# Patient Record
Sex: Female | Born: 1990 | Race: Black or African American | Hispanic: No | Marital: Single | Smoking: Never smoker
Health system: Southern US, Community
[De-identification: ages and names within clinical notes are randomized; demographics above are authoritative.]

## PROBLEM LIST (undated history)

## (undated) ENCOUNTER — Inpatient Hospital Stay (HOSPITAL_COMMUNITY): Payer: Self-pay

## (undated) DIAGNOSIS — Z789 Other specified health status: Secondary | ICD-10-CM

## (undated) DIAGNOSIS — L409 Psoriasis, unspecified: Secondary | ICD-10-CM

## (undated) DIAGNOSIS — O1400 Mild to moderate pre-eclampsia, unspecified trimester: Secondary | ICD-10-CM

## (undated) HISTORY — DX: Mild to moderate pre-eclampsia, unspecified trimester: O14.00

## (undated) HISTORY — PX: WISDOM TOOTH EXTRACTION: SHX21

---
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
## 2014-08-16 ENCOUNTER — Encounter (HOSPITAL_COMMUNITY): Payer: Self-pay

## 2017-02-06 ENCOUNTER — Emergency Department (HOSPITAL_COMMUNITY)
Admission: EM | Admit: 2017-02-06 | Discharge: 2017-02-06 | Disposition: A | Discharge status: Home / Self Care | Payer: Medicaid Other | Attending: Emergency Medicine | Admitting: Emergency Medicine

## 2017-02-06 ENCOUNTER — Encounter (HOSPITAL_COMMUNITY): Payer: Self-pay

## 2017-02-06 DIAGNOSIS — R21 Rash and other nonspecific skin eruption: Secondary | ICD-10-CM

## 2017-02-06 DIAGNOSIS — H60391 Other infective otitis externa, right ear: Secondary | ICD-10-CM

## 2017-02-06 MED ORDER — OFLOXACIN 0.3 % OP SOLN: 1.0000 [drp] | Freq: Two times a day (BID) | OPHTHALMIC | 0 refills | Status: DC

## 2017-02-06 MED ORDER — AMOXICILLIN 500 MG PO CAPS: 500.0000 mg | ORAL_CAPSULE | Freq: Three times a day (TID) | ORAL | 0 refills | Status: DC

## 2017-02-06 NOTE — ED Triage Notes (Signed)
Pt with bilateral earache for past 2 days.  Pt also has facial rash which has been treated with medication and no improvement.

## 2017-02-06 NOTE — ED Provider Notes (Signed)
[facility]-EMERGENCY DEPT Provider Note   CSN: [id number] Arrival date & time: 02/06/17  4098     History   Chief Complaint Chief Complaint  Patient presents with  . Otalgia  . Rash    HPI Shelley [name] is a 26 y.o. female.  HPI Pt comes in with cc of earache and rash. Pt reports that she has been having rash to the face and behind her ears for more than a year. She also has earache on the R side. Pt reports some drainage from the ear. Pt has no n/v/f/c/neck pain/stiffness. Pt reports no change to the rash. She has seen a dermatologist in the past, and the meds they gave her caused some improvement,  But then the rash worsened again. PT denies drug use.   History reviewed. No pertinent past medical history.  There are no active problems to display for this patient.   Past Surgical History:  Procedure Laterality Date  . WISDOM TOOTH EXTRACTION      OB History    Gravida Para Term Preterm AB Living   1 1 0 0 0 0   SAB TAB Ectopic Multiple Live Births   0 0 0 1 1       Home Medications    Prior to Admission medications   Medication Sig Start Date End Date Taking? Authorizing Provider  amoxicillin (AMOXIL) 500 MG capsule Take 1 capsule (500 mg total) by mouth 3 (three) times daily. 02/06/17   Derwood Kaplan, MD  ofloxacin (OCUFLOX) 0.3 % ophthalmic solution Place 1 drop into the right ear 2 (two) times daily. 02/06/17   Derwood Kaplan, MD  prenatal vitamin w/FE, FA (PRENATAL 1 + 1) 27-1 MG TABS Take 1 tablet by mouth at bedtime.      Historical Provider, MD    Family History Family History  Problem Relation Age of Onset  . Diabetes Maternal Grandmother   . Hypertension Maternal Grandmother   . Asthma Maternal Grandmother     Social History Social History  Substance Use Topics  . Smoking status: Never Smoker  . Smokeless tobacco: Never Used  . Alcohol use No     Allergies   Patient has no known allergies.   Review of Systems Review of Systems    Constitutional: Negative for activity change.  HENT: Positive for ear pain.   Respiratory: Negative for shortness of breath.   Gastrointestinal: Negative for nausea.  Skin: Positive for rash.  Allergic/Immunologic: Negative for immunocompromised state.     Physical Exam Updated Vital Signs BP 127/73 (BP Location: Left Arm)   Pulse 67   Temp 98.2 F (36.8 C) (Oral)   Resp 18   LMP  (LMP Unknown)   SpO2 100%   Physical Exam  Constitutional: She is oriented to person, place, and time. She appears well-developed.  HENT:  Head: Normocephalic and atraumatic.  Mouth/Throat: No oropharyngeal exudate.  R ear has debri in the external canal and the external TM is erythematous. Pt has no trismus, stridor or crepitus over the neck. Pt has no tonsillar enlargement and exudates. Pharynx is erythematous.   Eyes: EOM are normal.  Neck: Normal range of motion. Neck supple.  Cardiovascular: Normal rate.   Pulmonary/Chest: Effort normal.  Abdominal: Bowel sounds are normal.  Neurological: She is alert and oriented to person, place, and time.  Skin: Skin is warm and dry. Rash noted.  Nursing note and vitals reviewed.        ED Treatments / Results  Labs (all  labs ordered are listed, but only abnormal results are displayed) Labs Reviewed - No data to display  EKG  EKG Interpretation None       Radiology No results found.  Procedures Procedures (including critical care time)  Medications Ordered in ED Medications - No data to display   Initial Impression / Assessment and Plan / ED Course  I have reviewed the triage vital signs and the nursing notes.  Pertinent labs & imaging results that were available during my care of the patient were reviewed by me and considered in my medical decision making (see chart for details).     Rash - not new. Strongly advised that pt see a Dermatologist and continue to follow up with them. Could be fungal. The back of the ear is also  affected on both sides. No meds now. Strict ER return precautions have been discussed, and patient is agreeing with the plan and is comfortable with the workup done and the recommendations from the ER.  Earache - I think the rash within the ear has led to skin breakdown and resultant infection. 3rd infection this year, which is not common. Pt will get OE tx, advised amoxi only if ear not improving after 3 days of OE tx.   Final Clinical Impressions(s) / ED Diagnoses   Final diagnoses:  Other infective acute otitis externa of right ear  Skin rash    New Prescriptions Discharge Medication List as of 02/06/2017  9:38 AM    START taking these medications   Details  amoxicillin (AMOXIL) 500 MG capsule Take 1 capsule (500 mg total) by mouth 3 (three) times daily., Starting Wed 02/06/2017, Print    ofloxacin (OCUFLOX) 0.3 % ophthalmic solution Place 1 drop into the right ear 2 (two) times daily., Starting Wed 02/06/2017, Print         Derwood Kaplan, MD 02/07/17 [phone]

## 2017-02-06 NOTE — Discharge Instructions (Signed)
Please see the dermatologist as requested for optimal care of the rash.  START WITH THE TOPICAL EAR DROPS  - take the amoxicillin only if the symptoms are not improving after 2-3 days.  Please return to the ER if your symptoms worsen; you have increased pain, fevers, chills, inability to keep any medications down, headaches, neck pain, confusion. Otherwise see the outpatient doctor as requested.  Provided are names of a lot of dermatologist in town. Please contact one of them soon.

## 2017-08-21 ENCOUNTER — Inpatient Hospital Stay (HOSPITAL_COMMUNITY)
Admission: AD | Admit: 2017-08-21 | Discharge: 2017-08-21 | Disposition: A | Discharge status: Home / Self Care | Payer: Medicaid Other | Source: Ambulatory Visit | Attending: Obstetrics & Gynecology | Admitting: Obstetrics & Gynecology

## 2017-08-21 ENCOUNTER — Encounter (HOSPITAL_COMMUNITY): Payer: Self-pay | Admitting: *Deleted

## 2017-08-21 ENCOUNTER — Inpatient Hospital Stay (HOSPITAL_COMMUNITY): Payer: Medicaid Other

## 2017-08-21 DIAGNOSIS — N760 Acute vaginitis: Secondary | ICD-10-CM | POA: Diagnosis not present

## 2017-08-21 DIAGNOSIS — Z3A01 Less than 8 weeks gestation of pregnancy: Secondary | ICD-10-CM | POA: Insufficient documentation

## 2017-08-21 DIAGNOSIS — O219 Vomiting of pregnancy, unspecified: Secondary | ICD-10-CM | POA: Diagnosis not present

## 2017-08-21 DIAGNOSIS — O26891 Other specified pregnancy related conditions, first trimester: Secondary | ICD-10-CM

## 2017-08-21 DIAGNOSIS — B9689 Other specified bacterial agents as the cause of diseases classified elsewhere: Secondary | ICD-10-CM | POA: Diagnosis not present

## 2017-08-21 DIAGNOSIS — O23591 Infection of other part of genital tract in pregnancy, first trimester: Secondary | ICD-10-CM | POA: Diagnosis not present

## 2017-08-21 DIAGNOSIS — Z3491 Encounter for supervision of normal pregnancy, unspecified, first trimester: Secondary | ICD-10-CM

## 2017-08-21 DIAGNOSIS — R109 Unspecified abdominal pain: Secondary | ICD-10-CM | POA: Diagnosis present

## 2017-08-21 HISTORY — DX: Other specified health status: Z78.9

## 2017-08-21 LAB — URINALYSIS, ROUTINE W REFLEX MICROSCOPIC
Bacteria, UA: NONE SEEN
Bilirubin Urine: NEGATIVE
GLUCOSE, UA: NEGATIVE mg/dL
HGB URINE DIPSTICK: NEGATIVE
Ketones, ur: 80 mg/dL — AB
NITRITE: NEGATIVE
PH: 5 (ref 5.0–8.0)
Protein, ur: 30 mg/dL — AB
Specific Gravity, Urine: 1.028 (ref 1.005–1.030)

## 2017-08-21 LAB — CBC
HCT: 35.4 % — ABNORMAL LOW (ref 36.0–46.0)
Hemoglobin: 12.9 g/dL (ref 12.0–15.0)
MCH: 30.9 pg (ref 26.0–34.0)
MCHC: 36.4 g/dL — AB (ref 30.0–36.0)
MCV: 84.9 fL (ref 78.0–100.0)
PLATELETS: 312 10*3/uL (ref 150–400)
RBC: 4.17 MIL/uL (ref 3.87–5.11)
RDW: 11.2 % — ABNORMAL LOW (ref 11.5–15.5)
WBC: 7.5 10*3/uL (ref 4.0–10.5)

## 2017-08-21 LAB — ABO/RH: ABO/RH(D): A POS

## 2017-08-21 LAB — WET PREP, GENITAL
SPERM: NONE SEEN
TRICH WET PREP: NONE SEEN
Yeast Wet Prep HPF POC: NONE SEEN

## 2017-08-21 LAB — HCG, QUANTITATIVE, PREGNANCY: hCG, Beta Chain, Quant, S: 73666 m[IU]/mL — ABNORMAL HIGH (ref <5)

## 2017-08-21 LAB — POCT PREGNANCY, URINE: Preg Test, Ur: POSITIVE — AB

## 2017-08-21 MED ORDER — PROMETHAZINE HCL 25 MG RE SUPP: 25.0000 mg | Freq: Four times a day (QID) | RECTAL | 2 refills | Status: DC | PRN

## 2017-08-21 MED ORDER — METRONIDAZOLE 0.75 % VA GEL: 1.0000 | Freq: Two times a day (BID) | VAGINAL | 0 refills | Status: DC

## 2017-08-21 MED ORDER — PROMETHAZINE HCL 25 MG PO TABS: 25.0000 mg | ORAL_TABLET | Freq: Four times a day (QID) | ORAL | 2 refills | Status: DC | PRN

## 2017-08-21 NOTE — Discharge Instructions (Signed)
First Trimester of Pregnancy °The first trimester of pregnancy is from week 1 until the end of week 13 (months 1 through 3). A week after a sperm fertilizes an egg, the egg will implant on the wall of the uterus. This embryo will begin to develop into a baby. Genes from you and your partner will form the baby. The female genes will determine whether the baby will be a boy or a girl. At 6-8 weeks, the eyes and face will be formed, and the heartbeat can be seen on ultrasound. At the end of 12 weeks, all the baby's organs will be formed. °Now that you are pregnant, you will want to do everything you can to have a healthy baby. Two of the most important things are to get good prenatal care and to follow your health care provider's instructions. Prenatal care is all the medical care you receive before the baby's birth. This care will help prevent, find, and treat any problems during the pregnancy and childbirth. °Body changes during your first trimester °Your body goes through many changes during pregnancy. The changes vary from woman to woman. °· You may gain or lose a couple of pounds at first. °· You may feel sick to your stomach (nauseous) and you may throw up (vomit). If the vomiting is uncontrollable, call your health care provider. °· You may tire easily. °· You may develop headaches that can be relieved by medicines. All medicines should be approved by your health care provider. °· You may urinate more often. Painful urination may mean you have a bladder infection. °· You may develop heartburn as a result of your pregnancy. °· You may develop constipation because certain hormones are causing the muscles that push stool through your intestines to slow down. °· You may develop hemorrhoids or swollen veins (varicose veins). °· Your breasts may begin to grow larger and become tender. Your nipples may stick out more, and the tissue that surrounds them (areola) may become darker. °· Your gums may bleed and may be  sensitive to brushing and flossing. °· Dark spots or blotches (chloasma, mask of pregnancy) may develop on your face. This will likely fade after the baby is born. °· Your menstrual periods will stop. °· You may have a loss of appetite. °· You may develop cravings for certain kinds of food. °· You may have changes in your emotions from day to day, such as being excited to be pregnant or being concerned that something may go wrong with the pregnancy and baby. °· You may have more vivid and strange dreams. °· You may have changes in your hair. These can include thickening of your hair, rapid growth, and changes in texture. Some women also have hair loss during or after pregnancy, or hair that feels dry or thin. Your hair will most likely return to normal after your baby is born. ° °What to expect at prenatal visits °During a routine prenatal visit: °· You will be weighed to make sure you and the baby are growing normally. °· Your blood pressure will be taken. °· Your abdomen will be measured to track your baby's growth. °· The fetal heartbeat will be listened to between weeks 10 and 14 of your pregnancy. °· Test results from any previous visits will be discussed. ° °Your health care provider may ask you: °· How you are feeling. °· If you are feeling the baby move. °· If you have had any abnormal symptoms, such as leaking fluid, bleeding, severe headaches,   or abdominal cramping. °· If you are using any tobacco products, including cigarettes, chewing tobacco, and electronic cigarettes. °· If you have any questions. ° °Other tests that may be performed during your first trimester include: °· Blood tests to find your blood type and to check for the presence of any previous infections. The tests will also be used to check for low iron levels (anemia) and protein on red blood cells (Rh antibodies). Depending on your risk factors, or if you previously had diabetes during pregnancy, you may have tests to check for high blood  sugar that affects pregnant women (gestational diabetes). °· Urine tests to check for infections, diabetes, or protein in the urine. °· An ultrasound to confirm the proper growth and development of the baby. °· Fetal screens for spinal cord problems (spina bifida) and Down syndrome. °· HIV (human immunodeficiency virus) testing. Routine prenatal testing includes screening for HIV, unless you choose not to have this test. °· You may need other tests to make sure you and the baby are doing well. ° °Follow these instructions at home: °Medicines °· Follow your health care provider's instructions regarding medicine use. Specific medicines may be either safe or unsafe to take during pregnancy. °· Take a prenatal vitamin that contains at least 600 micrograms (mcg) of folic acid. °· If you develop constipation, try taking a stool softener if your health care provider approves. °Eating and drinking °· Eat a balanced diet that includes fresh fruits and vegetables, whole grains, good sources of protein such as meat, eggs, or tofu, and low-fat dairy. Your health care provider will help you determine the amount of weight gain that is right for you. °· Avoid raw meat and uncooked cheese. These carry germs that can cause birth defects in the baby. °· Eating four or five small meals rather than three large meals a day may help relieve nausea and vomiting. If you start to feel nauseous, eating a few soda crackers can be helpful. Drinking liquids between meals, instead of during meals, also seems to help ease nausea and vomiting. °· Limit foods that are high in fat and processed sugars, such as fried and sweet foods. °· To prevent constipation: °? Eat foods that are high in fiber, such as fresh fruits and vegetables, whole grains, and beans. °? Drink enough fluid to keep your urine clear or pale yellow. °Activity °· Exercise only as directed by your health care provider. Most women can continue their usual exercise routine during  pregnancy. Try to exercise for 30 minutes at least 5 days a week. Exercising will help you: °? Control your weight. °? Stay in shape. °? Be prepared for labor and delivery. °· Experiencing pain or cramping in the lower abdomen or lower back is a good sign that you should stop exercising. Check with your health care provider before continuing with normal exercises. °· Try to avoid standing for long periods of time. Move your legs often if you must stand in one place for a long time. °· Avoid heavy lifting. °· Wear low-heeled shoes and practice good posture. °· You may continue to have sex unless your health care provider tells you not to. °Relieving pain and discomfort °· Wear a good support bra to relieve breast tenderness. °· Take warm sitz baths to soothe any pain or discomfort caused by hemorrhoids. Use hemorrhoid cream if your health care provider approves. °· Rest with your legs elevated if you have leg cramps or low back pain. °· If you develop   varicose veins in your legs, wear support hose. Elevate your feet for 15 minutes, 3-4 times a day. Limit salt in your diet. Prenatal care  Schedule your prenatal visits by the twelfth week of pregnancy. They are usually scheduled monthly at first, then more often in the last 2 months before delivery.  Write down your questions. Take them to your prenatal visits.  Keep all your prenatal visits as told by your health care provider. This is important. Safety  Wear your seat belt at all times when driving.  Make a list of emergency phone numbers, including numbers for family, friends, the hospital, and police and fire departments. General instructions  Ask your health care provider for a referral to a local prenatal education class. Begin classes no later than the beginning of month 6 of your pregnancy.  Ask for help if you have counseling or nutritional needs during pregnancy. Your health care provider can offer advice or refer you to specialists for help  with various needs.  Do not use hot tubs, steam rooms, or saunas.  Do not douche or use tampons or scented sanitary pads.  Do not cross your legs for long periods of time.  Avoid cat litter boxes and soil used by cats. These carry germs that can cause birth defects in the baby and possibly loss of the fetus by miscarriage or stillbirth.  Avoid all smoking, herbs, alcohol, and medicines not prescribed by your health care provider. Chemicals in these products affect the formation and growth of the baby.  Do not use any products that contain nicotine or tobacco, such as cigarettes and e-cigarettes. If you need help quitting, ask your health care provider. You may receive counseling support and other resources to help you quit.  Schedule a dentist appointment. At home, brush your teeth with a soft toothbrush and be gentle when you floss. Contact a health care provider if:  You have dizziness.  You have mild pelvic cramps, pelvic pressure, or nagging pain in the abdominal area.  You have persistent nausea, vomiting, or diarrhea.  You have a bad smelling vaginal discharge.  You have pain when you urinate.  You notice increased swelling in your face, hands, legs, or ankles.  You are exposed to fifth disease or chickenpox.  You are exposed to [place] measles (rubella) and have never had it. Get help right away if:  You have a fever.  You are leaking fluid from your vagina.  You have spotting or bleeding from your vagina.  You have severe abdominal cramping or pain.  You have rapid weight gain or loss.  You vomit blood or material that looks like coffee grounds.  You develop a severe headache.  You have shortness of breath.  You have any kind of trauma, such as from a fall or a car accident. Summary  The first trimester of pregnancy is from week 1 until the end of week 13 (months 1 through 3).  Your body goes through many changes during pregnancy. The changes vary from  woman to woman.  You will have routine prenatal visits. During those visits, your health care provider will examine you, discuss any test results you may have, and talk with you about how you are feeling. This information is not intended to replace advice given to you by your health care provider. Make sure you discuss any questions you have with your health care provider. Document Released: 09/25/2001 Document Revised: 09/12/2016 Document Reviewed: 09/12/2016 Elsevier Interactive Patient Education  2017 Elsevier  Inc.  [place] [facility] [facility] Providers    [facility]       Phone: [phone]  [facility] [place] Phone: [phone]  [facility] [place]  Phone: [phone]  [facility]  Phone: [phone]  [facility] [place]  Phone: [phone]  [facility]       Phone: [phone]  [facility]    Phone: [phone]   [facility]    Phone: [phone]  [facility]    Phone: [phone]  [facility]    Phone: [phone]  [facility]    Phone: [phone]  [facility]       Phone: [phone]   [facility]  Phone: [phone]  [facility]    Phone: [phone]  [facility] [place]   Phone: [phone]  [facility]      Phone: [phone]  [facility]    Phone: [phone]  Safe Medications in Pregnancy   Acne: Benzoyl Peroxide Salicylic Acid  Backache/Headache: Tylenol: 2 regular strength every 4 hours OR              2 Extra strength every 6 hours  Colds/Coughs/Allergies: Benadryl (alcohol free) 25 mg every 6 hours as needed Breath right  strips Claritin Cepacol throat lozenges Chloraseptic throat spray Cold-Eeze- up to three times per day Cough drops, alcohol free Flonase (by prescription only) Guaifenesin Mucinex Robitussin DM (plain only, alcohol free) Saline nasal spray/drops Sudafed (pseudoephedrine) & Actifed ** use only after [redacted] weeks gestation and if you do not have high blood pressure Tylenol Vicks Vaporub Zinc lozenges Zyrtec   Constipation: Colace Ducolax suppositories Fleet enema Glycerin suppositories Metamucil Milk of magnesia Miralax Senokot Smooth move tea  Diarrhea: Kaopectate Imodium A-D  *NO pepto Bismol  Hemorrhoids: Anusol Anusol HC Preparation H Tucks  Indigestion: Tums Maalox Mylanta Zantac  Pepcid  Insomnia: Benadryl (alcohol free) 25mg  every 6 hours as needed Tylenol PM Unisom, no Gelcaps  Leg Cramps: Tums MagGel  Nausea/Vomiting:  Bonine Dramamine Emetrol Ginger extract Sea bands Meclizine  Nausea medication to take during pregnancy:  Unisom (doxylamine succinate 25 mg tablets) Take one tablet daily at bedtime. If symptoms are not adequately controlled, the dose can be increased to a maximum recommended dose of two tablets daily (1/2 tablet in the morning, 1/2 tablet mid-afternoon and one at bedtime). Vitamin B6 100mg  tablets. Take one tablet twice a day (up to 200 mg per day).  Skin Rashes: Aveeno products Benadryl cream or 25mg  every 6 hours as needed Calamine Lotion 1% cortisone cream  Yeast infection: Gyne-lotrimin 7 Monistat 7   **If taking multiple medications, please check labels to avoid duplicating the same active ingredients **take medication as directed on the label ** Do not exceed 4000 mg of tylenol in 24 hours **Do not take medications that contain aspirin or ibuprofen

## 2017-08-21 NOTE — MAU Provider Note (Signed)
History     CSN: [id number]  Arrival date and time: 08/21/17 1626   First Provider Initiated Contact with Patient 08/21/17 1702      Chief Complaint  Patient presents with  . Possible Pregnancy  . Abdominal Pain  . Emesis  . Nausea   HPI Shelley [name] is a 26 y.o. G2P0000 at [redacted]w[redacted]d who presents with lower abdominal pain. She states the pain started 2 days ago and is like cramping. She rates the pain a 6/10 and has not tried anything for the pain. She denies any vaginal bleeding or abnormal discharge. She also reports nausea and vomiting for the last 2 days. She throws up 2 times in the mornings.   OB History    Gravida Para Term Preterm AB Living   2 1 0 0 0 0   SAB TAB Ectopic Multiple Live Births   0 0 0 1 1      Past Medical History:  Diagnosis Date  . Medical history non-contributory     Past Surgical History:  Procedure Laterality Date  . WISDOM TOOTH EXTRACTION      Family History  Problem Relation Age of Onset  . Diabetes Maternal Grandmother   . Hypertension Maternal Grandmother   . Asthma Maternal Grandmother     Social History   Tobacco Use  . Smoking status: Never Smoker  . Smokeless tobacco: Never Used  Substance Use Topics  . Alcohol use: No  . Drug use: No    Allergies: No Known Allergies  Medications Prior to Admission  Medication Sig Dispense Refill Last Dose  . amoxicillin (AMOXIL) 500 MG capsule Take 1 capsule (500 mg total) by mouth 3 (three) times daily. 21 capsule 0   . ofloxacin (OCUFLOX) 0.3 % ophthalmic solution Place 1 drop into the right ear 2 (two) times daily. 5 mL 0   . prenatal vitamin w/FE, FA (PRENATAL 1 + 1) 27-1 MG TABS Take 1 tablet by mouth at bedtime.     Not Taking at Unknown time    Review of Systems  Constitutional: Negative.  Negative for fatigue and fever.  HENT: Negative.   Respiratory: Negative.  Negative for shortness of breath.   Cardiovascular: Negative.  Negative for chest pain.  Gastrointestinal:  Positive for abdominal pain, nausea and vomiting. Negative for constipation and diarrhea.  Genitourinary: Negative.  Negative for dysuria, vaginal bleeding and vaginal discharge.  Neurological: Negative.  Negative for dizziness and headaches.   Physical Exam   Blood pressure 112/82, pulse (!) 103, temperature 98.1 F (36.7 C), temperature source Oral, resp. rate 16, height 5\' 3"  (1.6 m), weight 116 lb (52.6 kg), last menstrual period 07/10/2017, SpO2 100 %.  Physical Exam  Nursing note and vitals reviewed. Constitutional: She is oriented to person, place, and time. She appears well-developed and well-nourished. No distress.  HENT:  Head: Normocephalic.  Eyes: Pupils are equal, round, and reactive to light.  Cardiovascular: Normal rate, regular rhythm and normal heart sounds.  Respiratory: Effort normal and breath sounds normal. No respiratory distress.  GI: Soft. Bowel sounds are normal. She exhibits no distension. There is no tenderness.  Neurological: She is alert and oriented to person, place, and time.  Skin: Skin is warm and dry.  Psychiatric: She has a normal mood and affect. Her behavior is normal. Judgment and thought content normal.   Pelvic exam: Cervix pink, visually closed, without lesion, scant white creamy discharge, vaginal walls and external genitalia normal Bimanual exam: Cervix 0/long/high, firm, anterior,  neg CMT, uterus nontender, adnexa without tenderness, enlargement, or mass  MAU Course  Procedures Results for orders placed or performed during the hospital encounter of 08/21/17 (from the past 24 hour(s))  Urinalysis, Routine w reflex microscopic     Status: Abnormal   Collection Time: 08/21/17  4:40 PM  Result Value Ref Range   Color, Urine YELLOW YELLOW   APPearance HAZY (A) CLEAR   Specific Gravity, Urine 1.028 1.005 - 1.030   pH 5.0 5.0 - 8.0   Glucose, UA NEGATIVE NEGATIVE mg/dL   Hgb urine dipstick NEGATIVE NEGATIVE   Bilirubin Urine NEGATIVE NEGATIVE    Ketones, ur 80 (A) NEGATIVE mg/dL   Protein, ur 30 (A) NEGATIVE mg/dL   Nitrite NEGATIVE NEGATIVE   Leukocytes, UA TRACE (A) NEGATIVE   RBC / HPF 0-5 0 - 5 RBC/hpf   WBC, UA 0-5 0 - 5 WBC/hpf   Bacteria, UA NONE SEEN NONE SEEN   Squamous Epithelial / LPF 0-5 (A) NONE SEEN   Mucus PRESENT   Pregnancy, urine POC     Status: Abnormal   Collection Time: 08/21/17  4:53 PM  Result Value Ref Range   Preg Test, Ur POSITIVE (A) NEGATIVE  CBC     Status: Abnormal   Collection Time: 08/21/17  5:04 PM  Result Value Ref Range   WBC 7.5 4.0 - 10.5 K/uL   RBC 4.17 3.87 - 5.11 MIL/uL   Hemoglobin 12.9 12.0 - 15.0 g/dL   HCT 16.135.4 (L) 09.636.0 - 04.546.0 %   MCV 84.9 78.0 - 100.0 fL   MCH 30.9 26.0 - 34.0 pg   MCHC 36.4 (H) 30.0 - 36.0 g/dL   RDW 40.911.2 (L) 81.111.5 - 91.415.5 %   Platelets 312 150 - 400 K/uL  Wet prep, genital     Status: Abnormal   Collection Time: 08/21/17  5:07 PM  Result Value Ref Range   Yeast Wet Prep HPF POC NONE SEEN NONE SEEN   Trich, Wet Prep NONE SEEN NONE SEEN   Clue Cells Wet Prep HPF POC PRESENT (A) NONE SEEN   WBC, Wet Prep HPF POC MANY (A) NONE SEEN   Sperm NONE SEEN   hCG, quantitative, pregnancy     Status: Abnormal   Collection Time: 08/21/17  5:25 PM  Result Value Ref Range   hCG, Beta Chain, Quant, S 73,666 (H) <5 mIU/mL  ABO/Rh     Status: None (Preliminary result)   Collection Time: 08/21/17  5:25 PM  Result Value Ref Range   ABO/RH(D) A POS     MDM UA- indicates 80 of ketones, offered patient IV fluids, patient declined UPT CBC, HCG ABO/Rh- A Pos US OB Transvaginal- single living IUP with HR of 134 bpm US OB Comp <14 weeks Wet prep and gc/chlamydia  Assessment and Plan   1. Normal IUP (intrauterine pregnancy) on prenatal ultrasound, first trimester   2. Abdominal cramps   3. [redacted] weeks gestation of pregnancy   4. Bacterial vaginosis   5. Nausea and vomiting during pregnancy    -Discharge home in stable condition -Rx for metrogel and promethazine sent  to patient's pharmacy -Patient advised to follow-up with OB of choice for prenatal care -Patient may return to MAU as needed or if her condition were to change or worsen  Rolm BookbinderCaroline M Neill CNM 08/21/2017, 6:30 PM   Allergies as of 08/21/2017   No Known Allergies     Medication List    STOP taking these medications  amoxicillin 500 MG capsule Commonly known as:  AMOXIL     TAKE these medications   metroNIDAZOLE 0.75 % vaginal gel Commonly known as:  METROGEL VAGINAL Place 1 Applicatorful 2 (two) times daily vaginally.   ofloxacin 0.3 % ophthalmic solution Commonly known as:  OCUFLOX Place 1 drop into the right ear 2 (two) times daily.   prenatal vitamin w/FE, FA 27-1 MG Tabs tablet Take 1 tablet by mouth at bedtime.   promethazine 25 MG suppository Commonly known as:  PHENERGAN Place 1 suppository (25 mg total) every 6 (six) hours as needed rectally for nausea or vomiting.   promethazine 25 MG tablet Commonly known as:  PHENERGAN Take 1 tablet (25 mg total) every 6 (six) hours as needed by mouth for nausea or vomiting.

## 2017-08-21 NOTE — MAU Note (Signed)
Pt reports she has not had anything to eat in 4 days, reports nausea and vomiting and lower abd cramping

## 2017-08-22 LAB — GC/CHLAMYDIA PROBE AMP (~~LOC~~) NOT AT ARMC
Chlamydia: NEGATIVE
Neisseria Gonorrhea: NEGATIVE

## 2017-09-24 DIAGNOSIS — Z3490 Encounter for supervision of normal pregnancy, unspecified, unspecified trimester: Secondary | ICD-10-CM | POA: Insufficient documentation

## 2017-09-25 ENCOUNTER — Encounter: Payer: Self-pay | Admitting: Certified Nurse Midwife

## 2017-10-03 ENCOUNTER — Other Ambulatory Visit (HOSPITAL_COMMUNITY)
Admission: RE | Admit: 2017-10-03 | Discharge: 2017-10-03 | Disposition: A | Discharge status: Home / Self Care | Payer: Medicaid Other | Source: Ambulatory Visit | Attending: Certified Nurse Midwife | Admitting: Certified Nurse Midwife

## 2017-10-03 ENCOUNTER — Encounter: Payer: Self-pay | Admitting: Certified Nurse Midwife

## 2017-10-03 ENCOUNTER — Ambulatory Visit (INDEPENDENT_AMBULATORY_CARE_PROVIDER_SITE_OTHER): Payer: Medicaid Other | Admitting: Certified Nurse Midwife

## 2017-10-03 VITALS — BP 129/74 | HR 94 | Wt 126.2 lb

## 2017-10-03 DIAGNOSIS — Z3A00 Weeks of gestation of pregnancy not specified: Secondary | ICD-10-CM | POA: Insufficient documentation

## 2017-10-03 DIAGNOSIS — O98819 Other maternal infectious and parasitic diseases complicating pregnancy, unspecified trimester: Secondary | ICD-10-CM | POA: Diagnosis not present

## 2017-10-03 DIAGNOSIS — B9689 Other specified bacterial agents as the cause of diseases classified elsewhere: Secondary | ICD-10-CM | POA: Insufficient documentation

## 2017-10-03 DIAGNOSIS — Z3480 Encounter for supervision of other normal pregnancy, unspecified trimester: Secondary | ICD-10-CM | POA: Diagnosis present

## 2017-10-03 DIAGNOSIS — N760 Acute vaginitis: Secondary | ICD-10-CM | POA: Insufficient documentation

## 2017-10-03 DIAGNOSIS — Z3481 Encounter for supervision of other normal pregnancy, first trimester: Secondary | ICD-10-CM | POA: Diagnosis not present

## 2017-10-03 DIAGNOSIS — O219 Vomiting of pregnancy, unspecified: Secondary | ICD-10-CM

## 2017-10-03 MED ORDER — DOXYLAMINE-PYRIDOXINE 10-10 MG PO TBEC: DELAYED_RELEASE_TABLET | ORAL | 4 refills | Status: DC

## 2017-10-03 MED ORDER — PRENATE PIXIE 10-0.6-0.4-200 MG PO CAPS: 1.0000 | ORAL_CAPSULE | Freq: Every day | ORAL | 12 refills | Status: AC

## 2017-10-03 NOTE — Progress Notes (Signed)
Subjective:   Shelley [name] is a [age] y.o. G2P1001 at 6036w5d by LMP, early ultrasound being seen today for her first obstetrical visit.  Her obstetrical history is significant for none. Patient does intend to breast feed. Pregnancy history fully reviewed.  Patient reports nausea, no bleeding, no contractions, no cramping and no leaking.  HISTORY: Obstetric History   G2   P1   T1   P0   A0   L1    SAB0   TAB0   Ectopic0   Multiple0   Live Births1     # Outcome Date GA Lbr Len/2nd Weight Sex Delivery Anes PTL Lv  2 Current           1 Term 03/04/11    M Vag-Spont  N LIV     Past Medical History:  Diagnosis Date  . Medical history non-contributory    Past Surgical History:  Procedure Laterality Date  . WISDOM TOOTH EXTRACTION     Family History  Problem Relation Age of Onset  . Diabetes Maternal Grandmother   . Hypertension Maternal Grandmother   . Asthma Maternal Grandmother    Social History   Tobacco Use  . Smoking status: Never Smoker  . Smokeless tobacco: Never Used  Substance Use Topics  . Alcohol use: No  . Drug use: No    Comment: not since pregnancy   No Known Allergies Current Outpatient Medications on File Prior to Visit  Medication Sig Dispense Refill  . prenatal vitamin w/FE, FA (PRENATAL 1 + 1) 27-1 MG TABS Take 1 tablet by mouth at bedtime.      . promethazine (PHENERGAN) 25 MG suppository Place 1 suppository (25 mg total) every 6 (six) hours as needed rectally for nausea or vomiting. 12 each 2   No current facility-administered medications on file prior to visit.      Exam   Vitals:   10/03/17 0944  BP: 129/74  Pulse: 94  Weight: 126 lb 3.2 oz (57.2 kg)      Uterus:     Pelvic Exam: Perineum: no hemorrhoids, normal perineum   Vulva: normal external genitalia, no lesions   Vagina:  normal mucosa, normal discharge   Cervix: no lesions and normal, pap smear done.    Adnexa: normal adnexa and no mass, fullness, tenderness   Bony Pelvis:  average  System: General: well-developed, well-nourished female in no acute distress   Breast:  normal appearance, no masses or tenderness   Skin: normal coloration and turgor, no rashes   Neurologic: oriented, normal, negative, normal mood   Extremities: normal strength, tone, and muscle mass, ROM of all joints is normal   HEENT PERRLA, extraocular movement intact and sclera clear, anicteric   Mouth/Teeth mucous membranes moist, pharynx normal without lesions and dental hygiene good   Neck supple and no masses   Cardiovascular: regular rate and rhythm   Respiratory:  no respiratory distress, normal breath sounds   Abdomen: soft, non-tender; bowel sounds normal; no masses,  no organomegaly     Assessment:   Pregnancy: G2P1001 Patient Active Problem List   Diagnosis Date Noted  . Supervision of normal pregnancy, antepartum 09/24/2017     Plan:  1. Supervision of other normal pregnancy, antepartum    - Prenat-FeAsp-Meth-FA-DHA w/o A (PRENATE PIXIE) 10-0.6-0.4-200 MG CAPS; Take 1 tablet by mouth daily.  Dispense: 30 capsule; Refill: 12 - Hemoglobinopathy evaluation - MaterniT21 PLUS Core+SCA - Obstetric Panel, Including HIV - Cystic Fibrosis Mutation 97 -  Hemoglobin A1c  2. Nausea/vomiting in pregnancy    - Doxylamine-Pyridoxine (DICLEGIS) 10-10 MG TBEC; Take 1 tablet with breakfast and lunch.  Take 2 tablets at bedtime.  Dispense: 100 tablet; Refill: 4   Initial labs drawn. Continue prenatal vitamins. Genetic Screening discussed, NIPS: ordered. Ultrasound discussed; fetal anatomic survey: ordered. Problem list reviewed and updated. The nature of [facility] with multiple MDs and other Advanced Practice Providers was explained to patient; also emphasized that residents, students are part of our team. Routine obstetric precautions reviewed. Return in about 4 weeks (around 10/31/2017) for ROB, MSAFP.     Orvilla Cornwallachelle Denney, CNM Center for  [facility], [facility]

## 2017-10-03 NOTE — Progress Notes (Signed)
Patient is in the office for initial ob visit, unplanned pregnancy. Pt is in a relationship and living together with fob.

## 2017-10-03 NOTE — Addendum Note (Signed)
Addended by: Natale MilchSTALLING, BRITTANY D on: 10/03/2017 11:11 AM   Modules accepted: Orders

## 2017-10-04 ENCOUNTER — Other Ambulatory Visit: Payer: Self-pay | Admitting: Certified Nurse Midwife

## 2017-10-04 DIAGNOSIS — E559 Vitamin D deficiency, unspecified: Secondary | ICD-10-CM | POA: Insufficient documentation

## 2017-10-04 LAB — CERVICOVAGINAL ANCILLARY ONLY
Bacterial vaginitis: POSITIVE — AB
CHLAMYDIA, DNA PROBE: NEGATIVE
Candida vaginitis: NEGATIVE
Neisseria Gonorrhea: NEGATIVE
Trichomonas: NEGATIVE

## 2017-10-04 LAB — VITAMIN D 25 HYDROXY (VIT D DEFICIENCY, FRACTURES): Vit D, 25-Hydroxy: 18.6 ng/mL — ABNORMAL LOW (ref 30.0–100.0)

## 2017-10-04 MED ORDER — VITAMIN D (ERGOCALCIFEROL) 1.25 MG (50000 UNIT) PO CAPS: 50000.0000 [IU] | ORAL_CAPSULE | ORAL | 2 refills | Status: DC

## 2017-10-05 LAB — OBSTETRIC PANEL, INCLUDING HIV
ANTIBODY SCREEN: NEGATIVE
BASOS: 0 %
Basophils Absolute: 0 10*3/uL (ref 0.0–0.2)
EOS (ABSOLUTE): 0.1 10*3/uL (ref 0.0–0.4)
EOS: 1 %
HEMATOCRIT: 34.7 % (ref 34.0–46.6)
HEMOGLOBIN: 11.7 g/dL (ref 11.1–15.9)
HEP B S AG: NEGATIVE
HIV Screen 4th Generation wRfx: NONREACTIVE
IMMATURE GRANS (ABS): 0.1 10*3/uL (ref 0.0–0.1)
IMMATURE GRANULOCYTES: 1 %
LYMPHS: 19 %
Lymphocytes Absolute: 1.6 10*3/uL (ref 0.7–3.1)
MCH: 30.5 pg (ref 26.6–33.0)
MCHC: 33.7 g/dL (ref 31.5–35.7)
MCV: 91 fL (ref 79–97)
MONOCYTES: 8 %
Monocytes Absolute: 0.7 10*3/uL (ref 0.1–0.9)
NEUTROS PCT: 71 %
Neutrophils Absolute: 6.2 10*3/uL (ref 1.4–7.0)
Platelets: 330 10*3/uL (ref 150–379)
RBC: 3.83 x10E6/uL (ref 3.77–5.28)
RDW: 13.4 % (ref 12.3–15.4)
RH TYPE: POSITIVE
RPR: NONREACTIVE
RUBELLA: 12.9 {index} (ref >0.99)
WBC: 8.7 10*3/uL (ref 3.4–10.8)

## 2017-10-05 LAB — HEMOGLOBINOPATHY EVALUATION
HEMOGLOBIN A2 QUANTITATION: 2.5 % (ref 1.8–3.2)
HGB A: 97.5 % (ref 96.4–98.8)
HGB C: 0 %
HGB S: 0 %
HGB VARIANT: 0 %
Hemoglobin F Quantitation: 0 % (ref 0.0–2.0)

## 2017-10-05 LAB — CULTURE, OB URINE

## 2017-10-05 LAB — HEMOGLOBIN A1C
Est. average glucose Bld gHb Est-mCnc: 94 mg/dL
Hgb A1c MFr Bld: 4.9 % (ref 4.8–5.6)

## 2017-10-05 LAB — URINE CULTURE, OB REFLEX: ORGANISM ID, BACTERIA: NO GROWTH

## 2017-10-07 LAB — CYTOLOGY - PAP
DIAGNOSIS: NEGATIVE
HPV (WINDOPATH): DETECTED — AB
HPV 16/18/45 GENOTYPING: NEGATIVE

## 2017-10-09 ENCOUNTER — Other Ambulatory Visit: Payer: Self-pay

## 2017-10-09 MED ORDER — ALBUTEROL SULFATE (2.5 MG/3ML) 0.083% IN NEBU
INHALATION_SOLUTION | RESPIRATORY_TRACT | Status: AC
  Filled 2017-10-09: qty 6

## 2017-10-11 LAB — MATERNIT21 PLUS CORE+SCA
Chromosome 13: NEGATIVE
Chromosome 18: NEGATIVE
Chromosome 21: NEGATIVE
Y CHROMOSOME: NOT DETECTED

## 2017-10-11 LAB — CYSTIC FIBROSIS MUTATION 97: Interpretation: NOT DETECTED

## 2017-10-15 NOTE — L&D Delivery Note (Signed)
Patient is 27 y.o. G2P1001 6152w6d admitted IOL for GHTN. S/p FB, AROM, pitocin  Delivery Note At 8:44 PM a viable female was delivered via Vaginal, Spontaneous (Presentation: ROA;  ).  APGAR and weight  pending Placenta status: delivered intact, trailing membraines .  Cord: 3V  Anesthesia:  Epidural Episiotomy:  None Lacerations:  None Est. Blood Loss (mL):  50mL  Mom to postpartum.  Baby to Couplet care / Skin to Skin.  Upon arrival patient was complete and pushing. She pushed with good maternal effort to deliver a vigorous baby girl. Baby delivered without difficulty, was noted to have good tone and place on maternal abdomen for oral suctioning, drying and stimulation. Delayed cord clamping performed. Pt has slow progression of delivery of placenta. This was assistance with 800 mg Cytotec, Buccal.  delivered intact with 3V cord. Vaginal canal and perineum was inspected and was without injury and hemostatic. Pitocin was started and uterus massaged until bleeding slowed. Counts of sharps, instruments, and lap pads were all correct.   Garnette GunnerAaron B Daylynn Stumpp, MD PGY-1 6/21/20199:19 PM

## 2017-10-21 ENCOUNTER — Other Ambulatory Visit: Payer: Self-pay | Admitting: Certified Nurse Midwife

## 2017-10-21 DIAGNOSIS — B9689 Other specified bacterial agents as the cause of diseases classified elsewhere: Secondary | ICD-10-CM

## 2017-10-21 DIAGNOSIS — Z3480 Encounter for supervision of other normal pregnancy, unspecified trimester: Secondary | ICD-10-CM

## 2017-10-21 DIAGNOSIS — N760 Acute vaginitis: Principal | ICD-10-CM

## 2017-10-21 MED ORDER — METRONIDAZOLE 0.75 % VA GEL: 1.0000 | Freq: Two times a day (BID) | VAGINAL | 0 refills | Status: DC

## 2017-10-24 ENCOUNTER — Other Ambulatory Visit: Payer: Self-pay | Admitting: Certified Nurse Midwife

## 2017-10-24 DIAGNOSIS — B977 Papillomavirus as the cause of diseases classified elsewhere: Secondary | ICD-10-CM | POA: Insufficient documentation

## 2017-10-24 DIAGNOSIS — Z3480 Encounter for supervision of other normal pregnancy, unspecified trimester: Secondary | ICD-10-CM

## 2017-10-31 ENCOUNTER — Encounter: Payer: Self-pay | Admitting: *Deleted

## 2017-10-31 ENCOUNTER — Ambulatory Visit (INDEPENDENT_AMBULATORY_CARE_PROVIDER_SITE_OTHER): Payer: Medicaid Other | Admitting: Certified Nurse Midwife

## 2017-10-31 ENCOUNTER — Encounter: Payer: Self-pay | Admitting: Certified Nurse Midwife

## 2017-10-31 VITALS — BP 120/80 | HR 71 | Wt 132.8 lb

## 2017-10-31 DIAGNOSIS — Z3482 Encounter for supervision of other normal pregnancy, second trimester: Secondary | ICD-10-CM

## 2017-10-31 DIAGNOSIS — O219 Vomiting of pregnancy, unspecified: Secondary | ICD-10-CM

## 2017-10-31 DIAGNOSIS — Z3480 Encounter for supervision of other normal pregnancy, unspecified trimester: Secondary | ICD-10-CM

## 2017-10-31 MED ORDER — ONDANSETRON HCL 8 MG PO TABS: 8.0000 mg | ORAL_TABLET | Freq: Three times a day (TID) | ORAL | 2 refills | Status: DC | PRN

## 2017-10-31 NOTE — Progress Notes (Signed)
   PRENATAL VISIT NOTE  Subjective:  Shelley [name] is a 27 y.o. G2P1001 at 6055w5d being seen today for ongoing prenatal care.  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  Patient reports nausea, no bleeding, no contractions, no cramping, no leaking and vomiting.  Contractions: Not present. Vag. Bleeding: None.   . Denies leaking of fluid.   The following portions of the patient's history were reviewed and updated as appropriate: allergies, current medications, past family history, past medical history, past social history, past surgical history and problem list. Problem list updated.  Objective:   Vitals:   10/31/17 0916  BP: 120/80  Pulse: 71  Weight: 132 lb 12.8 oz (60.2 kg)    Fetal Status: Fetal Heart Rate (bpm): 154; doppler         General:  Alert, oriented and cooperative. Patient is in no acute distress.  Skin: Skin is warm and dry. No rash noted.   Cardiovascular: Normal heart rate noted  Respiratory: Normal respiratory effort, no problems with respiration noted  Abdomen: Soft, gravid, appropriate for gestational age.  Pain/Pressure: Absent     Pelvic: Cervical exam deferred        Extremities: Normal range of motion.  Edema: None  Mental Status:  Normal mood and affect. Normal behavior. Normal judgment and thought content.   Assessment and Plan:  Pregnancy: G2P1001 at 9855w5d  1. Supervision of other normal pregnancy, antepartum      - US MFM OB COMP + 14 WK; Future - AFP, Serum, Open Spina Bifida  2. Nausea/vomiting in pregnancy     Was not able to get Diclegis d/t cost: encouraged to change to pregnancy medicaid - ondansetron (ZOFRAN) 8 MG tablet; Take 1 tablet (8 mg total) by mouth every 8 (eight) hours as needed for nausea or vomiting.  Dispense: 40 tablet; Refill: 2  Preterm labor symptoms and general obstetric  precautions including but not limited to vaginal bleeding, contractions, leaking of fluid and fetal movement were reviewed in detail with the patient. Please refer to After Visit Summary for other counseling recommendations.  Return in about 4 weeks (around 11/28/2017) for ROB.   Roe Coombsachelle A Denney, CNM

## 2017-10-31 NOTE — Progress Notes (Signed)
Pt was unable to pick up Metrogel d/t insurance issues.

## 2017-11-05 LAB — AFP, SERUM, OPEN SPINA BIFIDA
AFP MoM: 0.96
AFP Value: 42.6 ng/mL
GEST. AGE ON COLLECTION DATE: 17.1 wk
Maternal Age At EDD: 25.1 yr
OSBR Risk 1 IN: 10000
Test Results:: NEGATIVE
WEIGHT: 133 [lb_av]

## 2017-11-06 ENCOUNTER — Other Ambulatory Visit: Payer: Self-pay | Admitting: Certified Nurse Midwife

## 2017-11-06 DIAGNOSIS — Z3480 Encounter for supervision of other normal pregnancy, unspecified trimester: Secondary | ICD-10-CM

## 2017-11-07 ENCOUNTER — Encounter (HOSPITAL_COMMUNITY): Payer: Self-pay | Admitting: Certified Nurse Midwife

## 2017-11-14 ENCOUNTER — Other Ambulatory Visit: Payer: Self-pay | Admitting: Certified Nurse Midwife

## 2017-11-14 ENCOUNTER — Ambulatory Visit (HOSPITAL_COMMUNITY)
Admission: RE | Admit: 2017-11-14 | Discharge: 2017-11-14 | Disposition: A | Discharge status: Home / Self Care | Payer: Medicaid Other | Source: Ambulatory Visit | Attending: Certified Nurse Midwife | Admitting: Certified Nurse Midwife

## 2017-11-14 DIAGNOSIS — Z3482 Encounter for supervision of other normal pregnancy, second trimester: Secondary | ICD-10-CM | POA: Diagnosis not present

## 2017-11-14 DIAGNOSIS — Z3A18 18 weeks gestation of pregnancy: Secondary | ICD-10-CM

## 2017-11-14 DIAGNOSIS — Z3480 Encounter for supervision of other normal pregnancy, unspecified trimester: Secondary | ICD-10-CM

## 2017-11-14 DIAGNOSIS — Z363 Encounter for antenatal screening for malformations: Secondary | ICD-10-CM | POA: Insufficient documentation

## 2017-11-14 DIAGNOSIS — Z369 Encounter for antenatal screening, unspecified: Secondary | ICD-10-CM

## 2017-11-28 ENCOUNTER — Encounter: Payer: Medicaid Other | Admitting: Certified Nurse Midwife

## 2017-12-02 ENCOUNTER — Encounter: Payer: Medicaid Other | Admitting: Certified Nurse Midwife

## 2017-12-03 ENCOUNTER — Ambulatory Visit (INDEPENDENT_AMBULATORY_CARE_PROVIDER_SITE_OTHER): Payer: Medicaid Other | Admitting: Certified Nurse Midwife

## 2017-12-03 ENCOUNTER — Encounter: Payer: Self-pay | Admitting: Certified Nurse Midwife

## 2017-12-03 DIAGNOSIS — Z3480 Encounter for supervision of other normal pregnancy, unspecified trimester: Secondary | ICD-10-CM

## 2017-12-03 DIAGNOSIS — Z3482 Encounter for supervision of other normal pregnancy, second trimester: Secondary | ICD-10-CM

## 2017-12-03 NOTE — Patient Instructions (Addendum)
AREA PEDIATRIC/FAMILY PRACTICE PHYSICIANS  [facility] [address], [address] [place], [place]  [address] Phone - [phone]   Fax - [phone]  [facility] [address] [place], [place] [address] Phone - [phone]   Fax - [phone]  [facility] [address] [place], [place]  [address] Phone - [phone]   Fax - [phone]  [facility] [address] [place], [place]  [address] Phone - [phone]   Fax - [phone]  [facility] [address] [place], [place]  [address] Phone - [phone]   Fax - [phone]  [facility] [address], [address] [place], [place]  [address] Phone - [phone]   Fax - [phone]  [facility] [address], [address] [place], [place]  [address] Phone - [phone]   Fax - [phone]  [facility] [address] [place], [place]  [address] Phone - [phone]   Fax - [phone]  [facility] [address] [place], [place]  [address] Phone - [phone]   Fax - [phone] [facility] [address] [place], [place]  [address] Phone - [phone]   Fax - [phone]  [facility] [address] [place], [place]  [address] Phone - [phone]   Fax - [phone]  [facility] [address], [address] [place] [place], [place]  [address] Phone - [phone]   Fax - [phone]  [facility] [address], [address] [place], [place]  [address] Phone - [phone]   Fax - [phone]  [facility] [address], [address] [place], [place]  [address] Phone - [phone]  [facility] [address] [place], [place]  [address] Phone - [phone]   Fax - [phone]  [facility] [address], [address] [place], [place]  [address] Phone - [phone]   Fax - [phone]  [facility] [address] [place], [place]  [address] Phone - [phone]   Fax - [phone]  [facility] [address] [place], [place]  [address] Phone - [phone]   Fax - [phone]   [facility] [address] [place], [place]  [address] Phone - [phone]   Fax - [phone]  [facility] [address], [address] [place], [place]  [address] Phone - [phone]   Fax - [phone]  DAVID RUBIN [address], [address] [place], [place]  [address] Phone - [phone]   Fax - [phone]  [facility] [address] [place], [place]  [address] Phone - [phone]   Fax - [phone]  [facility] [address] [place], [place]  [address] Phone - [phone]   Fax - [phone] [facility] [place] [address] [place], [place]  [address] Phone - [phone]   Fax - [phone]  [facility] [address] [place], [place]  [address] Phone - [phone]   Fax - [phone]  [facility] [place] [address] [place], [place]  [address] Phone - [phone]   Fax - [phone]  [facility]  Wyvonne Lenzharlene Flemming MD [address] [place] [address] Phone [phone]  Fax [phone]   Second Trimester of Pregnancy The second trimester is from week 14 through week 27 (  months 4 through 6). The second trimester is often a time when you feel your best. Your body has adjusted to being pregnant, and you begin to feel better physically. Usually, morning sickness has lessened or quit completely, you may have more energy, and you may have an increase in appetite. The second trimester is also a time when the fetus is growing rapidly. At the end of the sixth month, the fetus is  about 9 inches long and weighs about 1 pounds. You will likely begin to feel the baby move (quickening) between 16 and 20 weeks of pregnancy. Body changes during your second trimester Your body continues to go through many changes during your second trimester. The changes vary from woman to woman.  Your weight will continue to increase. You will notice your lower abdomen bulging out.  You may begin to get stretch marks on your hips, abdomen, and breasts.  You may develop headaches that can be relieved by medicines. The medicines should be approved by your health care provider.  You may urinate more often because the fetus is pressing on your bladder.  You may develop or continue to have heartburn as a result of your pregnancy.  You may develop constipation because certain hormones are causing the muscles that push waste through your intestines to slow down.  You may develop hemorrhoids or swollen, bulging veins (varicose veins).  You may have back pain. This is caused by: ? Weight gain. ? Pregnancy hormones that are relaxing the joints in your pelvis. ? A shift in weight and the muscles that support your balance.  Your breasts will continue to grow and they will continue to become tender.  Your gums may bleed and may be sensitive to brushing and flossing.  Dark spots or blotches (chloasma, mask of pregnancy) may develop on your face. This will likely fade after the baby is born.  A dark line from your belly button to the pubic area (linea nigra) may appear. This will likely fade after the baby is born.  You may have changes in your hair. These can include thickening of your hair, rapid growth, and changes in texture. Some women also have hair loss during or after pregnancy, or hair that feels dry or thin. Your hair will most likely return to normal after your baby is born.  What to expect at prenatal visits During a routine prenatal visit:  You will be weighed to make sure you  and the fetus are growing normally.  Your blood pressure will be taken.  Your abdomen will be measured to track your baby's growth.  The fetal heartbeat will be listened to.  Any test results from the previous visit will be discussed.  Your health care provider may ask you:  How you are feeling.  If you are feeling the baby move.  If you have had any abnormal symptoms, such as leaking fluid, bleeding, severe headaches, or abdominal cramping.  If you are using any tobacco products, including cigarettes, chewing tobacco, and electronic cigarettes.  If you have any questions.  Other tests that may be performed during your second trimester include:  Blood tests that check for: ? Low iron levels (anemia). ? High blood sugar that affects pregnant women (gestational diabetes) between 17 and 28 weeks. ? Rh antibodies. This is to check for a protein on red blood cells (Rh factor).  Urine tests to check for infections, diabetes, or protein in the urine.  An ultrasound to confirm the proper growth and  development of the baby.  An amniocentesis to check for possible genetic problems.  Fetal screens for spina bifida and Down syndrome.  HIV (human immunodeficiency virus) testing. Routine prenatal testing includes screening for HIV, unless you choose not to have this test.  Follow these instructions at home: Medicines  Follow your health care provider's instructions regarding medicine use. Specific medicines may be either safe or unsafe to take during pregnancy.  Take a prenatal vitamin that contains at least 600 micrograms (mcg) of folic acid.  If you develop constipation, try taking a stool softener if your health care provider approves. Eating and drinking  Eat a balanced diet that includes fresh fruits and vegetables, whole grains, good sources of protein such as meat, eggs, or tofu, and low-fat dairy. Your health care provider will help you determine the amount of weight gain  that is right for you.  Avoid raw meat and uncooked cheese. These carry germs that can cause birth defects in the baby.  If you have low calcium intake from food, talk to your health care provider about whether you should take a daily calcium supplement.  Limit foods that are high in fat and processed sugars, such as fried and sweet foods.  To prevent constipation: ? Drink enough fluid to keep your urine clear or pale yellow. ? Eat foods that are high in fiber, such as fresh fruits and vegetables, whole grains, and beans. Activity  Exercise only as directed by your health care provider. Most women can continue their usual exercise routine during pregnancy. Try to exercise for 30 minutes at least 5 days a week. Stop exercising if you experience uterine contractions.  Avoid heavy lifting, wear low heel shoes, and practice good posture.  A sexual relationship may be continued unless your health care provider directs you otherwise. Relieving pain and discomfort  Wear a good support bra to prevent discomfort from breast tenderness.  Take warm sitz baths to soothe any pain or discomfort caused by hemorrhoids. Use hemorrhoid cream if your health care provider approves.  Rest with your legs elevated if you have leg cramps or low back pain.  If you develop varicose veins, wear support hose. Elevate your feet for 15 minutes, 3-4 times a day. Limit salt in your diet. Prenatal Care  Write down your questions. Take them to your prenatal visits.  Keep all your prenatal visits as told by your health care provider. This is important. Safety  Wear your seat belt at all times when driving.  Make a list of emergency phone numbers, including numbers for family, friends, the hospital, and police and fire departments. General instructions  Ask your health care provider for a referral to a local prenatal education class. Begin classes no later than the beginning of month 6 of your pregnancy.  Ask  for help if you have counseling or nutritional needs during pregnancy. Your health care provider can offer advice or refer you to specialists for help with various needs.  Do not use hot tubs, steam rooms, or saunas.  Do not douche or use tampons or scented sanitary pads.  Do not cross your legs for long periods of time.  Avoid cat litter boxes and soil used by cats. These carry germs that can cause birth defects in the baby and possibly loss of the fetus by miscarriage or stillbirth.  Avoid all smoking, herbs, alcohol, and unprescribed drugs. Chemicals in these products can affect the formation and growth of the baby.  Do not use any  products that contain nicotine or tobacco, such as cigarettes and e-cigarettes. If you need help quitting, ask your health care provider.  Visit your dentist if you have not gone yet during your pregnancy. Use a soft toothbrush to brush your teeth and be gentle when you floss. Contact a health care provider if:  You have dizziness.  You have mild pelvic cramps, pelvic pressure, or nagging pain in the abdominal area.  You have persistent nausea, vomiting, or diarrhea.  You have a bad smelling vaginal discharge.  You have pain when you urinate. Get help right away if:  You have a fever.  You are leaking fluid from your vagina.  You have spotting or bleeding from your vagina.  You have severe abdominal cramping or pain.  You have rapid weight gain or weight loss.  You have shortness of breath with chest pain.  You notice sudden or extreme swelling of your face, hands, ankles, feet, or legs.  You have not felt your baby move in over an hour.  You have severe headaches that do not go away when you take medicine.  You have vision changes. Summary  The second trimester is from week 14 through week 27 (months 4 through 6). It is also a time when the fetus is growing rapidly.  Your body goes through many changes during pregnancy. The changes  vary from woman to woman.  Avoid all smoking, herbs, alcohol, and unprescribed drugs. These chemicals affect the formation and growth your baby.  Do not use any tobacco products, such as cigarettes, chewing tobacco, and e-cigarettes. If you need help quitting, ask your health care provider.  Contact your health care provider if you have any questions. Keep all prenatal visits as told by your health care provider. This is important. This information is not intended to replace advice given to you by your health care provider. Make sure you discuss any questions you have with your health care provider. Document Released: 09/25/2001 Document Revised: 11/06/2016 Document Reviewed: 11/06/2016 Elsevier Interactive Patient Education  [employer].

## 2017-12-03 NOTE — Progress Notes (Signed)
   PRENATAL VISIT NOTE  Subjective:  Shelley [name] is a 27 y.o. G2P1001 at 2911w3d being seen today for ongoing prenatal care.  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  Patient reports no complaints.  Contractions: Not present. Vag. Bleeding: None.  Movement: Present. Denies leaking of fluid.   The following portions of the patient's history were reviewed and updated as appropriate: allergies, current medications, past family history, past medical history, past social history, past surgical history and problem list. Problem list updated.  Objective:   Vitals:   12/03/17 0934  BP: 107/66  Pulse: 80  Weight: 134 lb 12.8 oz (61.1 kg)    Fetal Status:     Movement: Present     General:  Alert, oriented and cooperative. Patient is in no acute distress.  Skin: Skin is warm and dry. No rash noted.   Cardiovascular: Normal heart rate noted  Respiratory: Normal respiratory effort, no problems with respiration noted  Abdomen: Soft, gravid, appropriate for gestational age.  Pain/Pressure: Absent     Pelvic: Cervical exam deferred        Extremities: Normal range of motion.     Mental Status:  Normal mood and affect. Normal behavior. Normal judgment and thought content.   Assessment and Plan:  Pregnancy: G2P1001 at 1711w3d  1. Supervision of other normal pregnancy, antepartum      Doing well.   Preterm labor symptoms and general obstetric precautions including but not limited to vaginal bleeding, contractions, leaking of fluid and fetal movement were reviewed in detail with the patient. Please refer to After Visit Summary for other counseling recommendations.  Return in about 4 weeks (around 12/31/2017) for ROB.   Roe Coombsachelle A Eevie Lapp, CNM

## 2017-12-25 ENCOUNTER — Encounter (HOSPITAL_COMMUNITY): Payer: Self-pay | Admitting: *Deleted

## 2017-12-25 ENCOUNTER — Inpatient Hospital Stay (HOSPITAL_COMMUNITY)
Admission: AD | Admit: 2017-12-25 | Discharge: 2017-12-26 | Disposition: A | Discharge status: Home / Self Care | Payer: Medicaid Other | Source: Ambulatory Visit | Attending: Obstetrics and Gynecology | Admitting: Obstetrics and Gynecology

## 2017-12-25 DIAGNOSIS — O219 Vomiting of pregnancy, unspecified: Secondary | ICD-10-CM | POA: Diagnosis not present

## 2017-12-25 DIAGNOSIS — Z3A24 24 weeks gestation of pregnancy: Secondary | ICD-10-CM | POA: Diagnosis not present

## 2017-12-25 DIAGNOSIS — O212 Late vomiting of pregnancy: Secondary | ICD-10-CM | POA: Diagnosis not present

## 2017-12-25 DIAGNOSIS — R112 Nausea with vomiting, unspecified: Secondary | ICD-10-CM | POA: Diagnosis present

## 2017-12-25 LAB — URINALYSIS, ROUTINE W REFLEX MICROSCOPIC
Bilirubin Urine: NEGATIVE
GLUCOSE, UA: NEGATIVE mg/dL
Hgb urine dipstick: NEGATIVE
Ketones, ur: 20 mg/dL — AB
LEUKOCYTES UA: NEGATIVE
Nitrite: NEGATIVE
PH: 6 (ref 5.0–8.0)
Protein, ur: NEGATIVE mg/dL
SPECIFIC GRAVITY, URINE: 1.021 (ref 1.005–1.030)

## 2017-12-25 NOTE — MAU Note (Signed)
Pt presents to MAU stating she ate raw meat at 0100. Pt states the burger she ate was pink in color. Pt states one side of the burger was done and while eating she noticed the other side was pink pt did not eat the whole thing. Pt states she has vomited 4x today. Pt reports feeling as if her heart is skipping a beat and reports some chest pain as well as back pain. Pt denies bleeding or LOF and reports +FM.

## 2017-12-26 DIAGNOSIS — Z3A24 24 weeks gestation of pregnancy: Secondary | ICD-10-CM | POA: Diagnosis not present

## 2017-12-26 DIAGNOSIS — O219 Vomiting of pregnancy, unspecified: Secondary | ICD-10-CM

## 2017-12-26 MED ORDER — PROMETHAZINE HCL 25 MG PO TABS: 25.0000 mg | ORAL_TABLET | Freq: Four times a day (QID) | ORAL | 0 refills | Status: DC | PRN

## 2017-12-26 NOTE — Discharge Instructions (Signed)

## 2017-12-26 NOTE — MAU Provider Note (Signed)
History     CSN: [id number]  Arrival date and time: 12/25/17 2254   None     Chief Complaint  Patient presents with  . Pt ate raw meat   HPI Ms Brookover is a 26yo G2P1001 @ 24.5wks who presents for eval for symptoms of occ N/V today and occ back pain. Concerned because she had a hamburger from [employer] approx 24h ago that was not done on one side. Denies ctx, leaking or bldg. No diarrhea or fever. Her preg has been followed by the [facility] service and has been essentially unremarkable.  OB History    Gravida Para Term Preterm AB Living   2 1 1  0 0 1   SAB TAB Ectopic Multiple Live Births   0 0 0 0 1      Past Medical History:  Diagnosis Date  . Medical history non-contributory     Past Surgical History:  Procedure Laterality Date  . WISDOM TOOTH EXTRACTION      Family History  Problem Relation Age of Onset  . Diabetes Maternal Grandmother   . Hypertension Maternal Grandmother   . Asthma Maternal Grandmother     Social History   Tobacco Use  . Smoking status: Never Smoker  . Smokeless tobacco: Never Used  Substance Use Topics  . Alcohol use: No  . Drug use: No    Comment: not since pregnancy    Allergies: No Known Allergies  Medications Prior to Admission  Medication Sig Dispense Refill Last Dose  . Doxylamine-Pyridoxine (DICLEGIS) 10-10 MG TBEC Take 1 tablet with breakfast and lunch.  Take 2 tablets at bedtime. 100 tablet 4 12/25/2017 at Unknown time  . Prenat-FeAsp-Meth-FA-DHA w/o A (PRENATE PIXIE) 10-0.6-0.4-200 MG CAPS Take 1 tablet by mouth daily. 30 capsule 12 12/25/2017 at Unknown time  . prenatal vitamin w/FE, FA (PRENATAL 1 + 1) 27-1 MG TABS Take 1 tablet by mouth at bedtime.     12/25/2017 at Unknown time  . metroNIDAZOLE (METROGEL VAGINAL) 0.75 % vaginal gel Place 1 Applicatorful vaginally 2 (two) times daily. (Patient not taking: Reported on 10/31/2017) 70 g 0 Not Taking  . ondansetron (ZOFRAN) 8 MG tablet Take 1 tablet (8 mg total) by mouth every 8  (eight) hours as needed for nausea or vomiting. (Patient not taking: Reported on 12/03/2017) 40 tablet 2 Not Taking  . promethazine (PHENERGAN) 25 MG suppository Place 1 suppository (25 mg total) every 6 (six) hours as needed rectally for nausea or vomiting. (Patient not taking: Reported on 10/31/2017) 12 each 2 Not Taking  . promethazine (PHENERGAN) 25 MG tablet TAKE 1 TABLET BY MOUTH EVERY 6 HOURS AS NEEDED FOR NAUSEA AND VOMITING (Patient not taking: Reported on 12/03/2017) 30 tablet 2 Not Taking  . Vitamin D, Ergocalciferol, (DRISDOL) 50000 units CAPS capsule Take 1 capsule (50,000 Units total) by mouth every 7 (seven) days. 30 capsule 2 Taking    Review of Systems No other symptoms other than what is listed in HPI  Physical Exam   Blood pressure 113/61, pulse 88, temperature 98.3 F (36.8 C), temperature source Oral, resp. rate 18, height 5\' 4"  (1.626 m), weight 63.1 kg (139 lb 1.3 oz), last menstrual period 07/11/2017.  Physical Exam  Constitutional: She is oriented to person, place, and time. She appears well-developed.  HENT:  Head: Normocephalic.  Neck: Normal range of motion.  Cardiovascular: Normal rate.  Respiratory: Effort normal.  GI:  EFM 140s, appropriate for GA Uterine irritability  Musculoskeletal: Normal range of motion.  Neurological:  She is alert and oriented to person, place, and time.  Skin: Skin is warm and dry.  Psychiatric: She has a normal mood and affect. Her behavior is normal. Thought content normal.   Urinalysis    Component Value Date/Time   COLORURINE YELLOW 12/25/2017 2301   APPEARANCEUR CLEAR 12/25/2017 2301   LABSPEC 1.021 12/25/2017 2301   PHURINE 6.0 12/25/2017 2301   GLUCOSEU NEGATIVE 12/25/2017 2301   HGBUR NEGATIVE 12/25/2017 2301   BILIRUBINUR NEGATIVE 12/25/2017 2301   KETONESUR 20 (A) 12/25/2017 2301   PROTEINUR NEGATIVE 12/25/2017 2301   UROBILINOGEN 0.2 04/21/2011 1021   NITRITE NEGATIVE 12/25/2017 2301   LEUKOCYTESUR NEGATIVE  12/25/2017 2301    MAU Course  Procedures  MDM UA, NST  Assessment and Plan  IUP@24 .5wks N/V of preg; not c/w food poisoning  D/C home with reassurance Encouraged fluids Rx Phenergan to supplement the Diclegis [facility]  Cam HaiSHAW, Hanni Milford CNM 12/26/2017, 12:04 AM

## 2017-12-26 NOTE — MAU Note (Signed)
Dr.Moss stated EKG was normal pt can be d/c.

## 2017-12-31 ENCOUNTER — Encounter: Payer: Medicaid Other | Admitting: Certified Nurse Midwife

## 2018-01-01 ENCOUNTER — Encounter: Payer: Medicaid Other | Admitting: Certified Nurse Midwife

## 2018-01-02 ENCOUNTER — Encounter: Payer: Self-pay | Admitting: Certified Nurse Midwife

## 2018-01-02 ENCOUNTER — Ambulatory Visit (INDEPENDENT_AMBULATORY_CARE_PROVIDER_SITE_OTHER): Payer: Medicaid Other | Admitting: Certified Nurse Midwife

## 2018-01-02 VITALS — BP 97/55 | HR 87 | Wt 139.0 lb

## 2018-01-02 DIAGNOSIS — E559 Vitamin D deficiency, unspecified: Secondary | ICD-10-CM

## 2018-01-02 DIAGNOSIS — Z3480 Encounter for supervision of other normal pregnancy, unspecified trimester: Secondary | ICD-10-CM

## 2018-01-02 NOTE — Patient Instructions (Addendum)
AREA PEDIATRIC/FAMILY PRACTICE PHYSICIANS  [facility] [address], [address] [place], [place]  [address] Phone - [phone]   Fax - [phone]  [facility] [address] [place], [place] [address] Phone - [phone]   Fax - [phone]  [facility] AM[facility] [address] [place], [place]  [address] Phone - [phone]   Fax - [phone]  [facility] [address] [place], [place]  [address] Phone - [phone]   Fax - [phone]  [facility] [address] [place], [place]  [address] Phone - [phone]   Fax - [phone]  [facility] [address] [place], [place]  [address] Phone - [phone]   Fax - [phone]  [facility] [address] [place], [place]  [address] Phone - [phone]   Fax - [phone]  [facility] [address], [address] [place], [place]  [address] Phone - [phone]   Fax - [phone]  [facility] [address] [place], [place]  [address] Phone - [phone]   Fax - [phone] [facility] [address] [place], [place]  [address] Phone - [phone]   Fax - [phone]  [facility] [address] [place], [place]  [address] Phone - [phone]   Fax - [phone]  [facility] [address] [place], [place]  [address] Phone - [phone]   Fax - [phone]  [facility] [address], [address] [place], [place]  [address] Phone - [phone]   Fax - [phone]  SHILPA GOSRANI [address] [place], [place]  [address] Phone - [phone]  [facility] [address] [place], [place]  [address] Phone - [phone]   Fax - [phone]  [facility] [address] [place], [place]  [address] Phone - [phone]   Fax - [phone]  [facility] [address] [place], [place]  [address] Phone - [phone]   Fax - [phone]  [facility] [address] [place], [place]  [address] Phone - [phone]   Fax - [phone]   [facility] [address] [place], [place]  [address] Phone - [phone]   Fax - [phone]  [facility] [address] [place], [place]  [address] Phone - [phone]   Fax - [phone]  DAVID RUBIN [address] [place], [place]  [address] Phone - [phone]   Fax - [phone]  [facility] [address] [place], [place]  [address] Phone - [phone]   Fax - [phone]  [facility] [address] , [place]  [address] Phone - [phone]   Fax - [phone] [facility] [address] [place], [place]  [address] Phone - [phone]   Fax - [phone]  [facility] [address] [place], [place]  [address] Phone - [phone]   Fax - [phone]   [facility] [address] [place], [place]  [address] Phone - [phone]   Fax - [phone]  [facility] Charlene Flemming MD [address] [place] [address] Phone [phone]  Fax [phone]   Braxton Hicks Contractions Contractions of the uterus can occur throughout pregnancy, but they are   are not always a sign that you are in labor. You may have practice contractions called Braxton Hicks contractions. These false labor contractions are sometimes confused with true labor. What are Braxton Hicks contractions? Braxton Hicks contractions are tightening movements that occur in the muscles of the uterus before labor. Unlike true labor contractions, these contractions do not result in  opening (dilation) and thinning of the cervix. Toward the end of pregnancy (32-34 weeks), Braxton Hicks contractions can happen more often and may become stronger. These contractions are sometimes difficult to tell apart from true labor because they can be very uncomfortable. You should not feel embarrassed if you go to the hospital with false labor. Sometimes, the only way to tell if you are in true labor is for your health care provider to look for changes in the cervix. The health care provider will do a physical exam and may monitor your contractions. If you are not in true labor, the exam should show that your cervix is not dilating and your water has not broken. If there are other health problems associated with your pregnancy, it is completely safe for you to be sent home with false labor. You may continue to have Braxton Hicks contractions until you go into true labor. How to tell the difference between true labor and false labor True labor  Contractions last 30-70 seconds.  Contractions become very regular.  Discomfort is usually felt in the top of the uterus, and it spreads to the lower abdomen and low back.  Contractions do not go away with walking.  Contractions usually become more intense and increase in frequency.  The cervix dilates and gets thinner. False labor  Contractions are usually shorter and not as strong as true labor contractions.  Contractions are usually irregular.  Contractions are often felt in the front of the lower abdomen and in the groin.  Contractions may go away when you walk around or change positions while lying down.  Contractions get weaker and are shorter-lasting as time goes on.  The cervix usually does not dilate or become thin. Follow these instructions at home:  Take over-the-counter and prescription medicines only as told by your health care provider.  Keep up with your usual exercises and follow other instructions from your health care  provider.  Eat and drink lightly if you think you are going into labor.  If Braxton Hicks contractions are making you uncomfortable: ? Change your position from lying down or resting to walking, or change from walking to resting. ? Sit and rest in a tub of warm water. ? Drink enough fluid to keep your urine pale yellow. Dehydration may cause these contractions. ? Do slow and deep breathing several times an hour.  Keep all follow-up prenatal visits as told by your health care provider. This is important. Contact a health care provider if:  You have a fever.  You have continuous pain in your abdomen. Get help right away if:  Your contractions become stronger, more regular, and closer together.  You have fluid leaking or gushing from your vagina.  You pass blood-tinged mucus (bloody show).  You have bleeding from your vagina.  You have low back pain that you never had before.  You feel your baby's head pushing down and causing pelvic pressure.  Your baby is not moving inside you as much as it used to. Summary  Contractions that occur before labor are called Braxton Hicks contractions, false labor, or practice contractions.  Braxton Hicks contractions are   shorter, weaker, farther apart, and less regular than true labor contractions. True labor contractions usually become progressively stronger and regular and they become more frequent.  Manage discomfort from [facility] contractions by changing position, resting in a warm bath, drinking plenty of water, or practicing deep breathing. This information is not intended to replace advice given to you by your health care provider. Make sure you discuss any questions you have with your health care provider. Document Released: 02/14/2017 Document Revised: 02/14/2017 Document Reviewed: 02/14/2017 Elsevier Interactive Patient Education  2018 [employer] Glucose Tolerance Test During Pregnancy The glucose tolerance test (GTT)  is a blood test used to determine if you have developed a type of diabetes during pregnancy (gestational diabetes). This is when your body does not properly process sugar (glucose) in the food you eat, resulting in high blood glucose levels. Typically, a GTT is done after you have had a 1-hour glucose test with results that indicate you possibly have gestational diabetes. It may also be done if:  You have a history of giving birth to very large babies or have experienced repeated fetal loss (stillbirth).  You have signs and symptoms of diabetes, such as: ? Changes in your vision. ? Tingling or numbness in your hands or feet. ? Changes in hunger, thirst, and urination not otherwise explained by your pregnancy.  The GTT lasts about 3 hours. You will be given a sugar-water solution to drink at the beginning of the test. You will have blood drawn before you drink the solution and then again 1, 2, and 3 hours after you drink it. You will not be allowed to eat or drink anything else during the test. You must remain at the testing location to make sure that your blood is drawn on time. You should also avoid exercising during the test, because exercise can alter test results. How do I prepare for this test? Eat normally for 3 days prior to the GTT test, including having plenty of carbohydrate-rich foods. Do not eat or drink anything except water during the final 12 hours before the test. In addition, your health care provider may ask you to stop taking certain medicines before the test. What do the results mean? It is your responsibility to obtain your test results. Ask the lab or department performing the test when and how you will get your results. Contact your health care provider to discuss any questions you have about your results. Range of Normal Values Ranges for normal values may vary among different labs and hospitals. You should always check with your health care provider after having lab work or  other tests done to discuss whether your values are considered within normal limits. Normal levels of blood glucose are as follows:  Fasting: less than 105 mg/dL.  1 hour after drinking the solution: less than 190 mg/dL.  2 hours after drinking the solution: less than 165 mg/dL.  3 hours after drinking the solution: less than 145 mg/dL.  Some substances can interfere with GTT results. These may include:  Blood pressure and heart failure medicines, including beta blockers, furosemide, and thiazides.  Anti-inflammatory medicines, including aspirin.  Nicotine.  Some psychiatric medicines.  Meaning of Results Outside Normal Value Ranges GTT test results that are above normal values may indicate a number of health problems, such as:  Gestational diabetes.  Acute stress response.  Cushing syndrome.  Tumors such as pheochromocytoma or glucagonoma.  Long-term kidney problems.  Pancreatitis.  Hyperthyroidism.  Current infection.  Discuss your  test results with your health care provider. He or she will use the results to make a diagnosis and determine a treatment plan that is right for you. This information is not intended to replace advice given to you by your health care provider. Make sure you discuss any questions you have with your health care provider. Document Released: 04/01/2012 Document Revised: 03/08/2016 Document Reviewed: 02/05/2014 Elsevier Interactive Patient Education  2018 [employer]  Second Trimester of Pregnancy The second trimester is from week 14 through week 27 (months 4 through 6). The second trimester is often a time when you feel your best. Your body has adjusted to being pregnant, and you begin to feel better physically. Usually, morning sickness has lessened or quit completely, you may have more energy, and you may have an increase in appetite. The second trimester is also a time when the fetus is growing rapidly. At the end of the sixth month, the  fetus is about 9 inches long and weighs about 1 pounds. You will likely begin to feel the baby move (quickening) between 16 and 20 weeks of pregnancy. Body changes during your second trimester Your body continues to go through many changes during your second trimester. The changes vary from woman to woman.  Your weight will continue to increase. You will notice your lower abdomen bulging out.  You may begin to get stretch marks on your hips, abdomen, and breasts.  You may develop headaches that can be relieved by medicines. The medicines should be approved by your health care provider.  You may urinate more often because the fetus is pressing on your bladder.  You may develop or continue to have heartburn as a result of your pregnancy.  You may develop constipation because certain hormones are causing the muscles that push waste through your intestines to slow down.  You may develop hemorrhoids or swollen, bulging veins (varicose veins).  You may have back pain. This is caused by: ? Weight gain. ? Pregnancy hormones that are relaxing the joints in your pelvis. ? A shift in weight and the muscles that support your balance.  Your breasts will continue to grow and they will continue to become tender.  Your gums may bleed and may be sensitive to brushing and flossing.  Dark spots or blotches (chloasma, mask of pregnancy) may develop on your face. This will likely fade after the baby is born.  A dark line from your belly button to the pubic area (linea nigra) may appear. This will likely fade after the baby is born.  You may have changes in your hair. These can include thickening of your hair, rapid growth, and changes in texture. Some women also have hair loss during or after pregnancy, or hair that feels dry or thin. Your hair will most likely return to normal after your baby is born.  What to expect at prenatal visits During a routine prenatal visit:  You will be weighed to make  sure you and the fetus are growing normally.  Your blood pressure will be taken.  Your abdomen will be measured to track your baby's growth.  The fetal heartbeat will be listened to.  Any test results from the previous visit will be discussed.  Your health care provider may ask you:  How you are feeling.  If you are feeling the baby move.  If you have had any abnormal symptoms, such as leaking fluid, bleeding, severe headaches, or abdominal cramping.  If you are using any tobacco products,  including cigarettes, chewing tobacco, and electronic cigarettes.  If you have any questions.  Other tests that may be performed during your second trimester include:  Blood tests that check for: ? Low iron levels (anemia). ? High blood sugar that affects pregnant women (gestational diabetes) between 3 and 28 weeks. ? Rh antibodies. This is to check for a protein on red blood cells (Rh factor).  Urine tests to check for infections, diabetes, or protein in the urine.  An ultrasound to confirm the proper growth and development of the baby.  An amniocentesis to check for possible genetic problems.  Fetal screens for spina bifida and Down syndrome.  HIV (human immunodeficiency virus) testing. Routine prenatal testing includes screening for HIV, unless you choose not to have this test.  Follow these instructions at home: Medicines  Follow your health care provider's instructions regarding medicine use. Specific medicines may be either safe or unsafe to take during pregnancy.  Take a prenatal vitamin that contains at least 600 micrograms (mcg) of folic acid.  If you develop constipation, try taking a stool softener if your health care provider approves. Eating and drinking  Eat a balanced diet that includes fresh fruits and vegetables, whole grains, good sources of protein such as meat, eggs, or tofu, and low-fat dairy. Your health care provider will help you determine the amount of  weight gain that is right for you.  Avoid raw meat and uncooked cheese. These carry germs that can cause birth defects in the baby.  If you have low calcium intake from food, talk to your health care provider about whether you should take a daily calcium supplement.  Limit foods that are high in fat and processed sugars, such as fried and sweet foods.  To prevent constipation: ? Drink enough fluid to keep your urine clear or pale yellow. ? Eat foods that are high in fiber, such as fresh fruits and vegetables, whole grains, and beans. Activity  Exercise only as directed by your health care provider. Most women can continue their usual exercise routine during pregnancy. Try to exercise for 30 minutes at least 5 days a week. Stop exercising if you experience uterine contractions.  Avoid heavy lifting, wear low heel shoes, and practice good posture.  A sexual relationship may be continued unless your health care provider directs you otherwise. Relieving pain and discomfort  Wear a good support bra to prevent discomfort from breast tenderness.  Take warm sitz baths to soothe any pain or discomfort caused by hemorrhoids. Use hemorrhoid cream if your health care provider approves.  Rest with your legs elevated if you have leg cramps or low back pain.  If you develop varicose veins, wear support hose. Elevate your feet for 15 minutes, 3-4 times a day. Limit salt in your diet. Prenatal Care  Write down your questions. Take them to your prenatal visits.  Keep all your prenatal visits as told by your health care provider. This is important. Safety  Wear your seat belt at all times when driving.  Make a list of emergency phone numbers, including numbers for family, friends, the hospital, and police and fire departments. General instructions  Ask your health care provider for a referral to a local prenatal education class. Begin classes no later than the beginning of month 6 of your  pregnancy.  Ask for help if you have counseling or nutritional needs during pregnancy. Your health care provider can offer advice or refer you to specialists for help with various needs.  Do not use hot tubs, steam rooms, or saunas.  Do not douche or use tampons or scented sanitary pads.  Do not cross your legs for long periods of time.  Avoid cat litter boxes and soil used by cats. These carry germs that can cause birth defects in the baby and possibly loss of the fetus by miscarriage or stillbirth.  Avoid all smoking, herbs, alcohol, and unprescribed drugs. Chemicals in these products can affect the formation and growth of the baby.  Do not use any products that contain nicotine or tobacco, such as cigarettes and e-cigarettes. If you need help quitting, ask your health care provider.  Visit your dentist if you have not gone yet during your pregnancy. Use a soft toothbrush to brush your teeth and be gentle when you floss. Contact a health care provider if:  You have dizziness.  You have mild pelvic cramps, pelvic pressure, or nagging pain in the abdominal area.  You have persistent nausea, vomiting, or diarrhea.  You have a bad smelling vaginal discharge.  You have pain when you urinate. Get help right away if:  You have a fever.  You are leaking fluid from your vagina.  You have spotting or bleeding from your vagina.  You have severe abdominal cramping or pain.  You have rapid weight gain or weight loss.  You have shortness of breath with chest pain.  You notice sudden or extreme swelling of your face, hands, ankles, feet, or legs.  You have not felt your baby move in over an hour.  You have severe headaches that do not go away when you take medicine.  You have vision changes. Summary  The second trimester is from week 14 through week 27 (months 4 through 6). It is also a time when the fetus is growing rapidly.  Your body goes through many changes during  pregnancy. The changes vary from woman to woman.  Avoid all smoking, herbs, alcohol, and unprescribed drugs. These chemicals affect the formation and growth your baby.  Do not use any tobacco products, such as cigarettes, chewing tobacco, and e-cigarettes. If you need help quitting, ask your health care provider.  Contact your health care provider if you have any questions. Keep all prenatal visits as told by your health care provider. This is important. This information is not intended to replace advice given to you by your health care provider. Make sure you discuss any questions you have with your health care provider. Document Released: 09/25/2001 Document Revised: 11/06/2016 Document Reviewed: 11/06/2016 Elsevier Interactive Patient Education  [employer].

## 2018-01-02 NOTE — Progress Notes (Signed)
   PRENATAL VISIT NOTE  Subjective:  Shelley [name] is a 27 y.o. G2P1001 at 6277w5d being seen today for ongoing prenatal care.  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  Patient reports no complaints.  Contractions: Not present. Vag. Bleeding: None.  Movement: Present. Denies leaking of fluid.   The following portions of the patient's history were reviewed and updated as appropriate: allergies, current medications, past family history, past medical history, past social history, past surgical history and problem list. Problem list updated.  Objective:   Vitals:   01/02/18 1354  BP: (!) 97/55  Pulse: 87  Weight: 139 lb (63 kg)    Fetal Status: Fetal Heart Rate (bpm): 148; doppler Fundal Height: 26 cm Movement: Present     General:  Alert, oriented and cooperative. Patient is in no acute distress.  Skin: Skin is warm and dry. No rash noted.   Cardiovascular: Normal heart rate noted  Respiratory: Normal respiratory effort, no problems with respiration noted  Abdomen: Soft, gravid, appropriate for gestational age.  Pain/Pressure: Present     Pelvic: Cervical exam deferred        Extremities: Normal range of motion.     Mental Status:  Normal mood and affect. Normal behavior. Normal judgment and thought content.   Assessment and Plan:  Pregnancy: G2P1001 at 8477w5d  1. Supervision of other normal pregnancy, antepartum      Doing well.  Recently had food poisoning.  Taking Dicelgis: not having vomiting episodes anymore.   2. Vitamin D deficiency     Taking weekly vitamin D  Preterm labor symptoms and general obstetric precautions including but not limited to vaginal bleeding, contractions, leaking of fluid and fetal movement were reviewed in detail with the patient. Please refer to After Visit Summary for other counseling  recommendations.  Return in about 2 weeks (around 01/16/2018) for ROB, 2 hr OGTT.   Roe Coombsachelle A Wynetta Seith, CNM

## 2018-01-16 ENCOUNTER — Other Ambulatory Visit: Payer: Medicaid Other

## 2018-01-16 ENCOUNTER — Ambulatory Visit (INDEPENDENT_AMBULATORY_CARE_PROVIDER_SITE_OTHER): Payer: Medicaid Other | Admitting: Certified Nurse Midwife

## 2018-01-16 ENCOUNTER — Encounter: Payer: Self-pay | Admitting: Certified Nurse Midwife

## 2018-01-16 VITALS — BP 100/62 | HR 98 | Temp 98.0°F | Wt 143.4 lb

## 2018-01-16 DIAGNOSIS — Z23 Encounter for immunization: Secondary | ICD-10-CM | POA: Diagnosis not present

## 2018-01-16 DIAGNOSIS — Z3482 Encounter for supervision of other normal pregnancy, second trimester: Secondary | ICD-10-CM

## 2018-01-16 DIAGNOSIS — E559 Vitamin D deficiency, unspecified: Secondary | ICD-10-CM

## 2018-01-16 DIAGNOSIS — Z3480 Encounter for supervision of other normal pregnancy, unspecified trimester: Secondary | ICD-10-CM

## 2018-01-16 NOTE — Patient Instructions (Addendum)
AREA PEDIATRIC/FAMILY PRACTICE PHYSICIANS  [facility] [address] [place], [place]  [address] Phone - [phone]   Fax - [phone]  [facility] [address] [place], [place] [address] Phone - [phone]   Fax - [phone]  JACK AMOS [address] [place], [place]  [address] Phone - [phone]   Fax - [phone]  [facility] [address] [place], [place]  [address] Phone - [phone]   Fax - [phone]  [facility] [address] [place], [place]  [address] Phone - [phone]   Fax - [phone]  [facility] [address] [place], [place]  [address] Phone - [phone]   Fax - [phone]  [facility] [address] [place], [place]  [address] Phone - [phone]   Fax - [phone]  [facility] [address] [place], [address] [place], [place]  [address] Phone - [phone]   Fax - [phone]  [facility] AT [facility] [address] [place], [place]  [address] Phone - [phone]   Fax - [phone] [facility] [place] [address] [place], [place]  [address] Phone - [phone]   Fax - [phone]  [facility] [address] [place], [place]  [address] Phone - [phone]   Fax - [phone]  [facility] [address], [address] [place], [place]  [address] Phone - [phone]   Fax - [phone]  [facility] [address] [place], [place]  [address] Phone - [phone]   Fax - [phone]  [facility] [address] [place], [place]  [address] Phone - [phone]  [facility] [address] [place], [place]  [address] Phone - [phone]   Fax - [phone]  [facility] [address] [place], [place]  [address] Phone - [phone]   Fax - [phone]  [facility] [address] [place], [place]  [address] Phone - [phone]   Fax - [phone]  [facility] [address] [place], [place]  [address] Phone - [phone]   Fax - [phone]   [facility] [address] [place] [address] [place], [place]  [address] Phone - [phone]   Fax - [phone]  [facility] [address] [place], [place]  [address] Phone - [phone]   Fax - [phone]  DAVID RUBIN [address] [place], [place]  [address] Phone - [phone]   Fax - [phone]  [facility] [address] [place], [place]  [address] Phone - [phone]   Fax - [phone]  [facility] Alita Chyle [address] [place], [place]  [address] Phone - [phone]   Fax - [phone] Gerarda Fraction [address] [place], [place]  [address] Phone - [phone]   Fax - [phone]  [facility] [address] [place], [place]  [address] Phone - [phone]   Fax - [phone]  [facility] [place] [address] [place], [place]  [address] Phone - [phone]   Fax - [phone]  [facility] [place] Wyvonne Lenz MD [address] [place] [address] Phone [phone]  Fax [phone]   Third Trimester of Pregnancy The third trimester is from week 28 through week 40 (  months 7 through 9). The third trimester is a time when the unborn baby (fetus) is growing rapidly. At the end of the ninth month, the fetus is about 20 inches in length and weighs 6-10 pounds. Body changes during your third trimester Your body will continue to go through many changes during pregnancy. The changes vary from woman to woman. During the third trimester:  Your weight will continue to increase.  You can expect to gain 25-35 pounds (11-16 kg) by the end of the pregnancy.  You may begin to get stretch marks on your hips, abdomen, and breasts.  You may urinate more often because the fetus is moving lower into your pelvis and pressing on your bladder.  You may develop or continue to have heartburn. This is caused by increased hormones that slow down muscles in the digestive tract.  You may develop or continue to have constipation because increased hormones slow digestion and cause the muscles that push waste through your intestines to relax.  You may develop hemorrhoids. These are swollen veins (varicose veins) in the rectum that can itch or be painful.  You may develop swollen, bulging veins (varicose veins) in your legs.  You may have increased body aches in the pelvis, back, or thighs. This is due to weight gain and increased hormones that are relaxing your joints.  You may have changes in your hair. These can include thickening of your hair, rapid growth, and changes in texture. Some women also have hair loss during or after pregnancy, or hair that feels dry or thin. Your hair will most likely return to normal after your baby is born.  Your breasts will continue to grow and they will continue to become tender. A yellow fluid (colostrum) may leak from your breasts. This is the first milk you are producing for your baby.  Your belly button may stick out.  You may notice more swelling in your hands, face, or ankles.  You may have increased tingling or numbness in your hands, arms, and legs. The skin on your belly may also feel numb.  You may feel short of breath because of your expanding uterus.  You may have more problems sleeping. This can be caused by the size of your belly, increased need to urinate, and an increase in your body's metabolism.  You may notice the fetus "dropping," or moving lower in your abdomen (lightening).  You may have increased vaginal discharge.  You  may notice your joints feel loose and you may have pain around your pelvic bone.  What to expect at prenatal visits You will have prenatal exams every 2 weeks until week 36. Then you will have weekly prenatal exams. During a routine prenatal visit:  You will be weighed to make sure you and the baby are growing normally.  Your blood pressure will be taken.  Your abdomen will be measured to track your baby's growth.  The fetal heartbeat will be listened to.  Any test results from the previous visit will be discussed.  You may have a cervical check near your due date to see if your cervix has softened or thinned (effaced).  You will be tested for Group B streptococcus. This happens between 35 and 37 weeks.  Your health care provider may ask you:  What your birth plan is.  How you are feeling.  If you are feeling the baby move.  If you have had any abnormal symptoms, such as leaking fluid, bleeding, severe headaches, or abdominal cramping.  If you are using any tobacco products, including cigarettes, chewing tobacco, and electronic cigarettes.  If you have any questions.  Other tests or screenings that may be performed during your third trimester include:  Blood tests that check for low iron levels (anemia).  Fetal testing to check the health, activity level, and growth of the fetus. Testing is done if you have certain medical conditions or if there are problems during the pregnancy.  Nonstress test (NST). This test checks the health of your baby to make sure there are no signs of problems, such as the baby not getting enough oxygen. During this test, a belt is placed around your belly. The baby is made to move, and its heart rate is monitored during movement.  What is false labor? False labor is a condition in which you feel small, irregular tightenings of the muscles in the womb (contractions) that usually go away with rest, changing position, or drinking water. These are  called Braxton Hicks contractions. Contractions may last for hours, days, or even weeks before true labor sets in. If contractions come at regular intervals, become more frequent, increase in intensity, or become painful, you should see your health care provider. What are the signs of labor?  Abdominal cramps.  Regular contractions that start at 10 minutes apart and become stronger and more frequent with time.  Contractions that start on the top of the uterus and spread down to the lower abdomen and back.  Increased pelvic pressure and dull back pain.  A watery or bloody mucus discharge that comes from the vagina.  Leaking of amniotic fluid. This is also known as your "water breaking." It could be a slow trickle or a gush. Let your health care provider know if it has a color or strange odor. If you have any of these signs, call your health care provider right away, even if it is before your due date. Follow these instructions at home: Medicines  Follow your health care provider's instructions regarding medicine use. Specific medicines may be either safe or unsafe to take during pregnancy.  Take a prenatal vitamin that contains at least 600 micrograms (mcg) of folic acid.  If you develop constipation, try taking a stool softener if your health care provider approves. Eating and drinking  Eat a balanced diet that includes fresh fruits and vegetables, whole grains, good sources of protein such as meat, eggs, or tofu, and low-fat dairy. Your health care provider will help you determine the amount of weight gain that is right for you.  Avoid raw meat and uncooked cheese. These carry germs that can cause birth defects in the baby.  If you have low calcium intake from food, talk to your health care provider about whether you should take a daily calcium supplement.  Eat four or five small meals rather than three large meals a day.  Limit foods that are high in fat and processed sugars, such  as fried and sweet foods.  To prevent constipation: ? Drink enough fluid to keep your urine clear or pale yellow. ? Eat foods that are high in fiber, such as fresh fruits and vegetables, whole grains, and beans. Activity  Exercise only as directed by your health care provider. Most women can continue their usual exercise routine during pregnancy. Try to exercise for 30 minutes at least 5 days a week. Stop exercising if you experience uterine contractions.  Avoid heavy lifting.  Do not exercise in extreme heat or humidity, or at high altitudes.  Wear low-heel, comfortable shoes.  Practice good posture.  You may continue to have sex unless your health care provider tells you otherwise. Relieving pain and discomfort  Take frequent breaks and rest with your legs elevated if you have leg cramps or low back pain.  Take warm sitz baths to soothe any pain or discomfort caused by hemorrhoids. Use hemorrhoid cream if your health care provider approves.  Wear a good support bra to prevent discomfort from breast tenderness.  If you develop varicose veins: ? Wear support pantyhose or compression stockings as told by your healthcare provider. ? Elevate your feet for 15 minutes, 3-4 times a day. Prenatal care  Write down your questions. Take them to your prenatal visits.  Keep all your prenatal visits as told by your health care provider. This is important. Safety  Wear your seat belt at all times when driving.  Make a list of emergency phone numbers, including numbers for family, friends, the hospital, and police and fire departments. General instructions  Avoid cat litter boxes and soil used by cats. These carry germs that can cause birth defects in the baby. If you have a cat, ask someone to clean the litter box for you.  Do not travel far distances unless it is absolutely necessary and only with the approval of your health care provider.  Do not use hot tubs, steam rooms, or  saunas.  Do not drink alcohol.  Do not use any products that contain nicotine or tobacco, such as cigarettes and e-cigarettes. If you need help quitting, ask your health care provider.  Do not use any medicinal herbs or unprescribed drugs. These chemicals affect the formation and growth of the baby.  Do not douche or use tampons or scented sanitary pads.  Do not cross your legs for long periods of time.  To prepare for the arrival of your baby: ? Take prenatal classes to understand, practice, and ask questions about labor and delivery. ? Make a trial run to the hospital. ? Visit the hospital and tour the maternity area. ? Arrange for maternity or paternity leave through employers. ? Arrange for family and friends to take care of pets while you are in the hospital. ? Purchase a rear-facing car seat and make sure you know how to install it in your car. ? Pack your hospital bag. ? Prepare the baby's nursery. Make sure to remove all pillows and stuffed animals from the baby's crib to prevent suffocation.  Visit your dentist if you have not gone during your pregnancy. Use a soft toothbrush to brush your teeth and be gentle when you floss. Contact a health care provider if:  You are unsure if you are in labor or if your water has broken.  You become dizzy.  You have mild pelvic cramps, pelvic pressure, or nagging pain in your abdominal area.  You have lower back pain.  You have persistent nausea, vomiting, or diarrhea.  You have an unusual or bad smelling vaginal discharge.  You have pain when you urinate. Get help right away if:  Your water breaks before 37 weeks.  You have regular contractions less than 5 minutes apart before 37 weeks.  You have a fever.  You are leaking fluid from your vagina.  You have spotting or bleeding from your vagina.  You have severe abdominal pain or cramping.  You have rapid weight loss or weight gain.  You have shortness of breath with  chest pain.  You notice sudden or extreme  swelling of your face, hands, ankles, feet, or legs.  Your baby makes fewer than 10 movements in 2 hours.  You have severe headaches that do not go away when you take medicine.  You have vision changes. Summary  The third trimester is from week 28 through week 40, months 7 through 9. The third trimester is a time when the unborn baby (fetus) is growing rapidly.  During the third trimester, your discomfort may increase as you and your baby continue to gain weight. You may have abdominal, leg, and back pain, sleeping problems, and an increased need to urinate.  During the third trimester your breasts will keep growing and they will continue to become tender. A yellow fluid (colostrum) may leak from your breasts. This is the first milk you are producing for your baby.  False labor is a condition in which you feel small, irregular tightenings of the muscles in the womb (contractions) that eventually go away. These are called Braxton Hicks contractions. Contractions may last for hours, days, or even weeks before true labor sets in.  Signs of labor can include: abdominal cramps; regular contractions that start at 10 minutes apart and become stronger and more frequent with time; watery or bloody mucus discharge that comes from the vagina; increased pelvic pressure and dull back pain; and leaking of amniotic fluid. This information is not intended to replace advice given to you by your health care provider. Make sure you discuss any questions you have with your health care provider. Document Released: 09/25/2001 Document Revised: 03/08/2016 Document Reviewed: 12/02/2012 Elsevier Interactive Patient Education  2017 Elsevier Inc.  [employer] CorporationBraxton Hicks Contractions Contractions of the uterus can occur throughout pregnancy, but they are not always a sign that you are in labor. You may have practice contractions called Braxton Hicks contractions. These false labor  contractions are sometimes confused with true labor. What are Deberah PeltonBraxton Hicks contractions? Braxton Hicks contractions are tightening movements that occur in the muscles of the uterus before labor. Unlike true labor contractions, these contractions do not result in opening (dilation) and thinning of the cervix. Toward the end of pregnancy (32-34 weeks), Braxton Hicks contractions can happen more often and may become stronger. These contractions are sometimes difficult to tell apart from true labor because they can be very uncomfortable. You should not feel embarrassed if you go to the hospital with false labor. Sometimes, the only way to tell if you are in true labor is for your health care provider to look for changes in the cervix. The health care provider will do a physical exam and may monitor your contractions. If you are not in true labor, the exam should show that your cervix is not dilating and your water has not broken. If there are other health problems associated with your pregnancy, it is completely safe for you to be sent home with false labor. You may continue to have Braxton Hicks contractions until you go into true labor. How to tell the difference between true labor and false labor True labor  Contractions last 30-70 seconds.  Contractions become very regular.  Discomfort is usually felt in the top of the uterus, and it spreads to the lower abdomen and low back.  Contractions do not go away with walking.  Contractions usually become more intense and increase in frequency.  The cervix dilates and gets thinner. False labor  Contractions are usually shorter and not as strong as true labor contractions.  Contractions are usually irregular.  Contractions are often felt  in the front of the lower abdomen and in the groin.  Contractions may go away when you walk around or change positions while lying down.  Contractions get weaker and are shorter-lasting as time goes on.  The  cervix usually does not dilate or become thin. Follow these instructions at home:  Take over-the-counter and prescription medicines only as told by your health care provider.  Keep up with your usual exercises and follow other instructions from your health care provider.  Eat and drink lightly if you think you are going into labor.  If Braxton Hicks contractions are making you uncomfortable: ? Change your position from lying down or resting to walking, or change from walking to resting. ? Sit and rest in a tub of warm water. ? Drink enough fluid to keep your urine pale yellow. Dehydration may cause these contractions. ? Do slow and deep breathing several times an hour.  Keep all follow-up prenatal visits as told by your health care provider. This is important. Contact a health care provider if:  You have a fever.  You have continuous pain in your abdomen. Get help right away if:  Your contractions become stronger, more regular, and closer together.  You have fluid leaking or gushing from your vagina.  You pass blood-tinged mucus (bloody show).  You have bleeding from your vagina.  You have low back pain that you never had before.  You feel your baby's head pushing down and causing pelvic pressure.  Your baby is not moving inside you as much as it used to. Summary  Contractions that occur before labor are called Braxton Hicks contractions, false labor, or practice contractions.  Braxton Hicks contractions are usually shorter, weaker, farther apart, and less regular than true labor contractions. True labor contractions usually become progressively stronger and regular and they become more frequent.  Manage discomfort from [facility] Center[facility] Hicks contractions by changing position, resting in a warm bath, drinking plenty of water, or practicing deep breathing. This information is not intended to replace advice given to you by your health care provider. Make sure you discuss any questions  you have with your health care provider. Document Released: 02/14/2017 Document Revised: 02/14/2017 Document Reviewed: 02/14/2017 Elsevier Interactive Patient Education  2018 [employer]  Before Baby Comes Home Before your baby arrives it is important to:  Have all of the supplies that you will need to care for your baby.  Know where to go if there is an emergency.  Discuss the baby's arrival with other family members.  What supplies will I need?  It is recommended that you have the following supplies: Large Items  Crib.  Crib mattress.  Rear-facing infant car seat. If possible, have a trained professional check to make sure that it is installed correctly.  Feeding  6-8 bottles that are 4-5 oz in size.  6-8 nipples.  Bottle brush.  Sterilizer, or a large pan or kettle with a lid.  A way to boil and cool water.  If you will be breastfeeding: ? Breast pump. ? Nipple cream. ? Nursing bra. ? Breast pads. ? Breast shields.  If you will be formula feeding: ? Formula. ? Measuring cups. ? Measuring spoons.  Bathing  Mild baby soap and baby shampoo.  Petroleum jelly.  Soft cloth towel and washcloth.  Hooded towel.  Cotton balls.  Bath basin.  Other Supplies  Rectal thermometer.  Bulb syringe.  Baby wipes or washcloths for diaper changes.  Diaper bag.  Changing pad.  Clothing,  including one-piece outfits and pajamas.  Baby nail clippers.  Receiving blankets.  Mattress pad and sheets for the crib.  Night-light for the baby's room.  Baby monitor.  2 or 3 pacifiers.  Either 24-36 cloth diapers and waterproof diaper covers or a box of disposable diapers. You may need to use as many as 10-12 diapers per day.  How do I prepare for an emergency? Prepare for an emergency by:  Knowing how to get to the nearest hospital.  Listing the phone numbers of your baby's health care providers near your home phone and in your cell phone.  How do I  prepare my family?  Decide how to handle visitors.  If you have other children: ? Talk with them about the baby coming home. Ask them how they feel about it. ? Read a book together about being a new big brother or sister. ? Find ways to let them help you prepare for the new baby. ? Have someone ready to care for them while you are in the hospital. This information is not intended to replace advice given to you by your health care provider. Make sure you discuss any questions you have with your health care provider. Document Released: 09/13/2008 Document Revised: 03/08/2016 Document Reviewed: 09/08/2014 Elsevier Interactive Patient Education  [employer].

## 2018-01-16 NOTE — Progress Notes (Signed)
Pt c/o stuffy and runny nose with clear drainage. No relief using Affrin Nasal Spray per pt. Denies fever and cough.

## 2018-01-16 NOTE — Progress Notes (Signed)
   PRENATAL VISIT NOTE  Subjective:  Shelley [name] is a 27 y.o. G2P1001 at 4267w5d being seen today for ongoing prenatal care.  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  Patient reports no bleeding, no contractions, no cramping, no leaking and allergy symptoms for 2 months, denies fever/cough, just nasal congestion and runny nose.  OTC medications discussed.  Contractions: Not present. Vag. Bleeding: None.  Movement: Present. Denies leaking of fluid.   The following portions of the patient's history were reviewed and updated as appropriate: allergies, current medications, past family history, past medical history, past social history, past surgical history and problem list. Problem list updated.  Objective:   Vitals:   01/16/18 0948  BP: 100/62  Pulse: 98  Temp: 98 F (36.7 C)  Weight: 143 lb 6.4 oz (65 kg)    Fetal Status: Fetal Heart Rate (bpm): 145; doppler Fundal Height: 27 cm Movement: Present     General:  Alert, oriented and cooperative. Patient is in no acute distress.  Skin: Skin is warm and dry. No rash noted.   Cardiovascular: Normal heart rate noted  Respiratory: Normal respiratory effort, no problems with respiration noted  Abdomen: Soft, gravid, appropriate for gestational age.  Pain/Pressure: Absent     Pelvic: Cervical exam deferred        Extremities: Normal range of motion.  Edema: None  Mental Status: Normal mood and affect. Normal behavior. Normal judgment and thought content.   Assessment and Plan:  Pregnancy: G2P1001 at 3567w5d  1. Supervision of other normal pregnancy, antepartum      Doing well.  Allergy symptoms discussed.  OTC medication recommendations given.   - Glucose Tolerance, 2 Hours w/1 Hour - CBC - HIV antibody - RPR  2. Vitamin D deficiency    Taking weekly vitamin D.   Preterm labor  symptoms and general obstetric precautions including but not limited to vaginal bleeding, contractions, leaking of fluid and fetal movement were reviewed in detail with the patient. Please refer to After Visit Summary for other counseling recommendations.  Return in about 2 weeks (around 01/30/2018) for ROB.  No future appointments.  Roe Coombsachelle A Denney, CNM.

## 2018-01-17 LAB — CBC
HEMATOCRIT: 31.6 % — AB (ref 34.0–46.6)
HEMOGLOBIN: 10.5 g/dL — AB (ref 11.1–15.9)
MCH: 30 pg (ref 26.6–33.0)
MCHC: 33.2 g/dL (ref 31.5–35.7)
MCV: 90 fL (ref 79–97)
Platelets: 248 10*3/uL (ref 150–379)
RBC: 3.5 x10E6/uL — ABNORMAL LOW (ref 3.77–5.28)
RDW: 13.2 % (ref 12.3–15.4)
WBC: 7.8 10*3/uL (ref 3.4–10.8)

## 2018-01-17 LAB — RPR: RPR Ser Ql: NONREACTIVE

## 2018-01-17 LAB — HIV ANTIBODY (ROUTINE TESTING W REFLEX): HIV Screen 4th Generation wRfx: NONREACTIVE

## 2018-01-17 LAB — GLUCOSE TOLERANCE, 2 HOURS W/ 1HR
Glucose, 1 hour: 76 mg/dL (ref 65–179)
Glucose, 2 hour: 61 mg/dL — ABNORMAL LOW (ref 65–152)
Glucose, Fasting: 72 mg/dL (ref 65–91)

## 2018-01-21 ENCOUNTER — Other Ambulatory Visit: Payer: Self-pay | Admitting: Certified Nurse Midwife

## 2018-01-21 DIAGNOSIS — O99013 Anemia complicating pregnancy, third trimester: Secondary | ICD-10-CM | POA: Insufficient documentation

## 2018-01-21 DIAGNOSIS — Z3480 Encounter for supervision of other normal pregnancy, unspecified trimester: Secondary | ICD-10-CM

## 2018-01-21 MED ORDER — CITRANATAL BLOOM 90-1 MG PO TABS: 1.0000 | ORAL_TABLET | Freq: Every day | ORAL | 12 refills | Status: DC

## 2018-01-30 ENCOUNTER — Ambulatory Visit (INDEPENDENT_AMBULATORY_CARE_PROVIDER_SITE_OTHER): Payer: Medicaid Other | Admitting: Certified Nurse Midwife

## 2018-01-30 ENCOUNTER — Encounter: Payer: Self-pay | Admitting: Certified Nurse Midwife

## 2018-01-30 VITALS — BP 110/69 | HR 79 | Wt 139.0 lb

## 2018-01-30 DIAGNOSIS — Z3483 Encounter for supervision of other normal pregnancy, third trimester: Secondary | ICD-10-CM

## 2018-01-30 DIAGNOSIS — E559 Vitamin D deficiency, unspecified: Secondary | ICD-10-CM

## 2018-01-30 DIAGNOSIS — O99013 Anemia complicating pregnancy, third trimester: Secondary | ICD-10-CM

## 2018-01-30 DIAGNOSIS — D649 Anemia, unspecified: Secondary | ICD-10-CM

## 2018-01-30 DIAGNOSIS — Z3480 Encounter for supervision of other normal pregnancy, unspecified trimester: Secondary | ICD-10-CM

## 2018-01-30 NOTE — Progress Notes (Signed)
   PRENATAL VISIT NOTE  Subjective:  Shelley [name] is a 27 y.o. G2P1001 at 2758w5d being seen today for ongoing prenatal care.  She is currently monitored for the following issues for this low-risk pregnancy and has Supervision of normal pregnancy, antepartum; Vitamin D deficiency; HPV in female; and Anemia during pregnancy in third trimester on their problem list.  Patient reports no complaints.  Contractions: Not present. Vag. Bleeding: None.  Movement: Present. Denies leaking of fluid.   The following portions of the patient's history were reviewed and updated as appropriate: allergies, current medications, past family history, past medical history, past social history, past surgical history and problem list. Problem list updated.  Objective:   Vitals:   01/30/18 0818  BP: 110/69  Pulse: 79  Weight: 139 lb (63 kg)    Fetal Status: Fetal Heart Rate (bpm): 141; doppler Fundal Height: 29 cm Movement: Present     General:  Alert, oriented and cooperative. Patient is in no acute distress.  Skin: Skin is warm and dry. No rash noted.   Cardiovascular: Normal heart rate noted  Respiratory: Normal respiratory effort, no problems with respiration noted  Abdomen: Soft, gravid, appropriate for gestational age.  Pain/Pressure: Present     Pelvic: Cervical exam deferred        Extremities: Normal range of motion.  Edema: None  Mental Status: Normal mood and affect. Normal behavior. Normal judgment and thought content.   Assessment and Plan:  Pregnancy: G2P1001 at 5158w5d  1. Supervision of other normal pregnancy, antepartum      Doing well  2. Vitamin D deficiency     Taking weekly vitamin D  3. Anemia during pregnancy in third trimester     Bloom sent to the pharmacy previously. Iron rich diet recommended.   Preterm labor symptoms and general obstetric precautions including but not limited to vaginal bleeding, contractions, leaking of fluid and fetal movement were reviewed in detail  with the patient. Please refer to After Visit Summary for other counseling recommendations.  Return in about 2 weeks (around 02/13/2018) for ROB.  Future Appointments  Date Time Provider Department Center  02/13/2018  8:45 AM Roe Coombsenney, Nivedita Mirabella A, CNM [facility] None    Roe Coombsachelle A Mega Kinkade, CNM

## 2018-02-13 ENCOUNTER — Ambulatory Visit (INDEPENDENT_AMBULATORY_CARE_PROVIDER_SITE_OTHER): Payer: Medicaid Other | Admitting: Certified Nurse Midwife

## 2018-02-13 VITALS — BP 118/79 | HR 102 | Wt 143.0 lb

## 2018-02-13 DIAGNOSIS — E559 Vitamin D deficiency, unspecified: Secondary | ICD-10-CM

## 2018-02-13 DIAGNOSIS — O99013 Anemia complicating pregnancy, third trimester: Secondary | ICD-10-CM

## 2018-02-13 DIAGNOSIS — Z3480 Encounter for supervision of other normal pregnancy, unspecified trimester: Secondary | ICD-10-CM

## 2018-02-13 MED ORDER — CITRANATAL BLOOM 90-1 MG PO TABS: 1.0000 | ORAL_TABLET | Freq: Every day | ORAL | 12 refills | Status: DC

## 2018-02-13 NOTE — Progress Notes (Signed)
   PRENATAL VISIT NOTE  Subjective:  Shelley [name] is a 27 y.o. G2P1001 at [redacted]w[redacted]d being seen today for ongoing prenatal care.  She is currently monitored for the following issues for this low-risk pregnancy and has Supervision of normal pregnancy, antepartum; Vitamin D deficiency; HPV in female; and Anemia during pregnancy in third trimester on their problem list.  Patient reports no complaints.  Contractions: Not present. Vag. Bleeding: None.  Movement: Present. Denies leaking of fluid.   The following portions of the patient's history were reviewed and updated as appropriate: allergies, current medications, past family history, past medical history, past social history, past surgical history and problem list. Problem list updated.  Objective:   Vitals:   02/13/18 0853  BP: 118/79  Pulse: (!) 102  Weight: 143 lb (64.9 kg)    Fetal Status: Fetal Heart Rate (bpm): 142; doppler Fundal Height: 30 cm Movement: Present     General:  Alert, oriented and cooperative. Patient is in no acute distress.  Skin: Skin is warm and dry. No rash noted.   Cardiovascular: Normal heart rate noted  Respiratory: Normal respiratory effort, no problems with respiration noted  Abdomen: Soft, gravid, appropriate for gestational age.  Pain/Pressure: Absent     Pelvic: Cervical exam deferred        Extremities: Normal range of motion.     Mental Status: Normal mood and affect. Normal behavior. Normal judgment and thought content.   Assessment and Plan:  Pregnancy: G2P1001 at [redacted]w[redacted]d  1. Supervision of other normal pregnancy, antepartum      Doing well  2. Anemia during pregnancy in third trimester     Sent to a different pharmacy. - Prenatal-DSS-FeCb-FeGl-FA (CITRANATAL BLOOM) 90-1 MG TABS; Take 1 tablet by mouth daily.  Dispense: 30 tablet; Refill: 12  3. Vitamin D deficiency     Taking weekly vitamin D  Preterm labor symptoms and general obstetric precautions including but not limited to vaginal  bleeding, contractions, leaking of fluid and fetal movement were reviewed in detail with the patient. Please refer to After Visit Summary for other counseling recommendations.  Return in about 2 weeks (around 02/27/2018) for ROB.  No future appointments.  Roe Coombs, CNM

## 2018-02-27 ENCOUNTER — Other Ambulatory Visit (HOSPITAL_COMMUNITY)
Admission: RE | Admit: 2018-02-27 | Discharge: 2018-02-27 | Disposition: A | Discharge status: Home / Self Care | Payer: Medicaid Other | Source: Ambulatory Visit | Attending: Certified Nurse Midwife | Admitting: Certified Nurse Midwife

## 2018-02-27 ENCOUNTER — Ambulatory Visit (INDEPENDENT_AMBULATORY_CARE_PROVIDER_SITE_OTHER): Payer: Medicaid Other | Admitting: Certified Nurse Midwife

## 2018-02-27 ENCOUNTER — Encounter: Payer: Self-pay | Admitting: Certified Nurse Midwife

## 2018-02-27 VITALS — BP 122/79 | HR 87 | Wt 147.0 lb

## 2018-02-27 DIAGNOSIS — O99013 Anemia complicating pregnancy, third trimester: Secondary | ICD-10-CM

## 2018-02-27 DIAGNOSIS — N949 Unspecified condition associated with female genital organs and menstrual cycle: Secondary | ICD-10-CM

## 2018-02-27 DIAGNOSIS — O4703 False labor before 37 completed weeks of gestation, third trimester: Secondary | ICD-10-CM | POA: Diagnosis not present

## 2018-02-27 DIAGNOSIS — E559 Vitamin D deficiency, unspecified: Secondary | ICD-10-CM

## 2018-02-27 DIAGNOSIS — Z3A33 33 weeks gestation of pregnancy: Secondary | ICD-10-CM | POA: Insufficient documentation

## 2018-02-27 DIAGNOSIS — O26893 Other specified pregnancy related conditions, third trimester: Secondary | ICD-10-CM | POA: Insufficient documentation

## 2018-02-27 DIAGNOSIS — R102 Pelvic and perineal pain: Secondary | ICD-10-CM | POA: Diagnosis not present

## 2018-02-27 DIAGNOSIS — Z3480 Encounter for supervision of other normal pregnancy, unspecified trimester: Secondary | ICD-10-CM

## 2018-02-27 LAB — FETAL FIBRONECTIN: Fetal Fibronectin: NEGATIVE

## 2018-02-27 MED ORDER — COMFORT FIT MATERNITY SUPP LG MISC
1.0000 [IU] | Freq: Every day | 0 refills | Status: DC
Start: 2018-02-27 — End: 2023-09-05

## 2018-02-27 NOTE — Patient Instructions (Signed)
Abdominal Pain During Pregnancy Abdominal pain is common in pregnancy. Most of the time, it does not cause harm. There are many causes of abdominal pain. Some causes are more serious than others and sometimes the cause is not known. Abdominal pain can be a sign that something is very wrong with the pregnancy or the pain may have nothing to do with the pregnancy. Always tell your health care provider if you have any abdominal pain. Follow these instructions at home:  Do not have sex or put anything in your vagina until your symptoms go away completely.  Watch your abdominal pain for any changes.  Get plenty of rest until your pain improves.  Drink enough fluid to keep your urine clear or pale yellow.  Take over-the-counter or prescription medicines only as told by your health care provider.  Keep all follow-up visits as told by your health care provider. This is important. Contact a health care provider if:  You have a fever.  Your pain gets worse or you have cramping.  Your pain continues after resting. Get help right away if:  You are bleeding, leaking fluid, or passing tissue from the vagina.  You have vomiting or diarrhea that does not go away.  You have painful or bloody urination.  You notice a decrease in your baby's movements.  You feel very weak or faint.  You have shortness of breath.  You develop a severe headache with abdominal pain.  You have abnormal vaginal discharge with abdominal pain. This information is not intended to replace advice given to you by your health care provider. Make sure you discuss any questions you have with your health care provider. Document Released: 10/01/2005 Document Revised: 07/12/2016 Document Reviewed: 04/30/2013 Elsevier Interactive Patient Education  2018 [employer].  Before Baby Comes Home Before your baby arrives it is important to:  Have all of the supplies that you will need to care for your baby.  Know where to go  if there is an emergency.  Discuss the baby's arrival with other family members.  What supplies will I need?  It is recommended that you have the following supplies: Large Items  Crib.  Crib mattress.  Rear-facing infant car seat. If possible, have a trained professional check to make sure that it is installed correctly.  Feeding  6-8 bottles that are 4-5 oz in size.  6-8 nipples.  Bottle brush.  Sterilizer, or a large pan or kettle with a lid.  A way to boil and cool water.  If you will be breastfeeding: ? Breast pump. ? Nipple cream. ? Nursing bra. ? Breast pads. ? Breast shields.  If you will be formula feeding: ? Formula. ? Measuring cups. ? Measuring spoons.  Bathing  Mild baby soap and baby shampoo.  Petroleum jelly.  Soft cloth towel and washcloth.  Hooded towel.  Cotton balls.  Bath basin.  Other Supplies  Rectal thermometer.  Bulb syringe.  Baby wipes or washcloths for diaper changes.  Diaper bag.  Changing pad.  Clothing, including one-piece outfits and pajamas.  Baby nail clippers.  Receiving blankets.  Mattress pad and sheets for the crib.  Night-light for the baby's room.  Baby monitor.  2 or 3 pacifiers.  Either 24-36 cloth diapers and waterproof diaper covers or a box of disposable diapers. You may need to use as many as 10-12 diapers per day.  How do I prepare for an emergency? Prepare for an emergency by:  Knowing how to get to the  nearest hospital.  Listing the phone numbers of your baby's health care providers near your home phone and in your cell phone.  How do I prepare my family?  Decide how to handle visitors.  If you have other children: ? Talk with them about the baby coming home. Ask them how they feel about it. ? Read a book together about being a new big brother or sister. ? Find ways to let them help you prepare for the new baby. ? Have someone ready to care for them while you are in the  hospital. This information is not intended to replace advice given to you by your health care provider. Make sure you discuss any questions you have with your health care provider. Document Released: 09/13/2008 Document Revised: 03/08/2016 Document Reviewed: 09/08/2014 Elsevier Interactive Patient Education  2018 [employer].  [employer] of the uterus can occur throughout pregnancy, but they are not always a sign that you are in labor. You may have practice contractions called Braxton Hicks contractions. These false labor contractions are sometimes confused with true labor. What are Deberah Pelton contractions? Braxton Hicks contractions are tightening movements that occur in the muscles of the uterus before labor. Unlike true labor contractions, these contractions do not result in opening (dilation) and thinning of the cervix. Toward the end of pregnancy (32-34 weeks), Braxton Hicks contractions can happen more often and may become stronger. These contractions are sometimes difficult to tell apart from true labor because they can be very uncomfortable. You should not feel embarrassed if you go to the hospital with false labor. Sometimes, the only way to tell if you are in true labor is for your health care provider to look for changes in the cervix. The health care provider will do a physical exam and may monitor your contractions. If you are not in true labor, the exam should show that your cervix is not dilating and your water has not broken. If there are other health problems associated with your pregnancy, it is completely safe for you to be sent home with false labor. You may continue to have Braxton Hicks contractions until you go into true labor. How to tell the difference between true labor and false labor True labor  Contractions last 30-70 seconds.  Contractions become very regular.  Discomfort is usually felt in the top of the uterus, and it spreads to the  lower abdomen and low back.  Contractions do not go away with walking.  Contractions usually become more intense and increase in frequency.  The cervix dilates and gets thinner. False labor  Contractions are usually shorter and not as strong as true labor contractions.  Contractions are usually irregular.  Contractions are often felt in the front of the lower abdomen and in the groin.  Contractions may go away when you walk around or change positions while lying down.  Contractions get weaker and are shorter-lasting as time goes on.  The cervix usually does not dilate or become thin. Follow these instructions at home:  Take over-the-counter and prescription medicines only as told by your health care provider.  Keep up with your usual exercises and follow other instructions from your health care provider.  Eat and drink lightly if you think you are going into labor.  If Braxton Hicks contractions are making you uncomfortable: ? Change your position from lying down or resting to walking, or change from walking to resting. ? Sit and rest in a tub of warm water. ?  Drink enough fluid to keep your urine pale yellow. Dehydration may cause these contractions. ? Do slow and deep breathing several times an hour.  Keep all follow-up prenatal visits as told by your health care provider. This is important. Contact a health care provider if:  You have a fever.  You have continuous pain in your abdomen. Get help right away if:  Your contractions become stronger, more regular, and closer together.  You have fluid leaking or gushing from your vagina.  You pass blood-tinged mucus (bloody show).  You have bleeding from your vagina.  You have low back pain that you never had before.  You feel your baby's head pushing down and causing pelvic pressure.  Your baby is not moving inside you as much as it used to. Summary  Contractions that occur before labor are called Braxton Hicks  contractions, false labor, or practice contractions.  Braxton Hicks contractions are usually shorter, weaker, farther apart, and less regular than true labor contractions. True labor contractions usually become progressively stronger and regular and they become more frequent.  Manage discomfort from [facility] contractions by changing position, resting in a warm bath, drinking plenty of water, or practicing deep breathing. This information is not intended to replace advice given to you by your health care provider. Make sure you discuss any questions you have with your health care provider. Document Released: 02/14/2017 Document Revised: 02/14/2017 Document Reviewed: 02/14/2017 Elsevier Interactive Patient Education  2018 [employer] Fetal Fibronectin Fetal fibronectin (fFN) is a protein that your body produces during pregnancy. This protein is normally found in your vaginal fluid in early pregnancy and just before delivery. It should not be there between 22 and 35 weeks of pregnancy. Having fFN in your vagina between 22 and 35 weeks could be a warning sign that your baby will be born early (prematurely). Babies born prematurely, or before 37 weeks, may have trouble breathing or feeding. A negative fFN test between 22 and 35 weeks means that it is unlikely you will have a premature delivery in the next 2 weeks. You may have this test if you have symptoms of premature labor. These include:  Contractions.  Increased vaginal discharge.  Backache.  If there is a chance of preterm labor and delivery, your health care provider will monitor you carefully and take steps to delay your labor if necessary. This test requires a sample of fluid from inside your vagina. Your health care provider collects this sample using a cotton swab. How do I prepare for this test?  Ask your health care provider if: ? You need to avoid using lubricants or douches before this exam. ? You need to avoid sexual  intercourse for 24 hours before the exam.  Tell your health care provider if you have a vaginal yeast infection or any symptoms of a yeast infection: ? Itching. ? Soreness. ? Discharge. What do the results mean? It is your responsibility to obtain your test results. Ask the lab or department performing the test when and how you will get your results. Contact your health care provider to discuss any questions you have about your results. The results of this test will be positive or negative. Meaning of Negative Test Results A negative result means no fFN was found in your vaginal fluid. A negative result means that there is very little chance you will go into labor in the next two weeks. You may have this test again in two weeks if you are still having symptoms  of early labor. Meaning of Positive Test Results A positive result means fFN was found in your vaginal fluid. A positive result does not mean you will go into early labor. It does mean your risk is greater. Your health care provider may do other tests and exams to closely follow your pregnancy. Talk with your health care provider to discuss your results, treatment options, and if necessary, the need for more tests. Talk with your health care provider if you have any questions about your results. This information is not intended to replace advice given to you by your health care provider. Make sure you discuss any questions you have with your health care provider. Document Released: 08/02/2004 Document Revised: 06/05/2016 Document Reviewed: 12/29/2013 Elsevier Interactive Patient Education  2018 [employer].  Preterm Labor and Birth Information Pregnancy normally lasts 39-41 weeks. Preterm labor is when labor starts early. It starts before you have been pregnant for 37 whole weeks. What are the risk factors for preterm labor? Preterm labor is more likely to occur in women who:  Have an infection while pregnant.  Have a cervix that is  short.  Have gone into preterm labor before.  Have had surgery on their cervix.  Are younger than age 47.  Are older than age 74.  Are African American.  Are pregnant with two or more babies.  Take street drugs while pregnant.  Smoke while pregnant.  Do not gain enough weight while pregnant.  Got pregnant right after another pregnancy.  What are the symptoms of preterm labor? Symptoms of preterm labor include:  Cramps. The cramps may feel like the cramps some women get during their period. The cramps may happen with watery poop (diarrhea).  Pain in the belly (abdomen).  Pain in the lower back.  Regular contractions or tightening. It may feel like your belly is getting tighter.  Pressure in the lower belly that seems to get stronger.  More fluid (discharge) leaking from the vagina. The fluid may be watery or bloody.  Water breaking.  Why is it important to notice signs of preterm labor? Babies who are born early may not be fully developed. They have a higher chance for:  Long-term heart problems.  Long-term lung problems.  Trouble controlling body systems, like breathing.  Bleeding in the brain.  A condition called cerebral palsy.  Learning difficulties.  Death.  These risks are highest for babies who are born before 34 weeks of pregnancy. How is preterm labor treated? Treatment depends on:  How long you were pregnant.  Your condition.  The health of your baby.  Treatment may involve:  Having a stitch (suture) placed in your cervix. When you give birth, your cervix opens so the baby can come out. The stitch keeps the cervix from opening too soon.  Staying at the hospital.  Taking or getting medicines, such as: ? Hormone medicines. ? Medicines to stop contractions. ? Medicines to help the baby's lungs develop. ? Medicines to prevent your baby from having cerebral palsy.  What should I do if I am in preterm labor? If you think you are going  into labor too soon, call your doctor right away. How can I prevent preterm labor?  Do not use any tobacco products. ? Examples of these are cigarettes, chewing tobacco, and e-cigarettes. ? If you need help quitting, ask your doctor.  Do not use street drugs.  Do not use any medicines unless you ask your doctor if they are safe for you.  Talk with your doctor before taking any herbal supplements.  Make sure you gain enough weight.  Watch for infection. If you think you might have an infection, get it checked right away.  If you have gone into preterm labor before, tell your doctor. This information is not intended to replace advice given to you by your health care provider. Make sure you discuss any questions you have with your health care provider. Document Released: 12/28/2008 Document Revised: 03/13/2016 Document Reviewed: 02/22/2016 Elsevier Interactive Patient Education  2018 [employer].  Third Trimester of Pregnancy The third trimester is from week 29 through week 42, months 7 through 9. This trimester is when your unborn baby (fetus) is growing very fast. At the end of the ninth month, the unborn baby is about 20 inches in length. It weighs about 6-10 pounds. Follow these instructions at home:  Avoid all smoking, herbs, and alcohol. Avoid drugs not approved by your doctor.  Do not use any tobacco products, including cigarettes, chewing tobacco, and electronic cigarettes. If you need help quitting, ask your doctor. You may get counseling or other support to help you quit.  Only take medicine as told by your doctor. Some medicines are safe and some are not during pregnancy.  Exercise only as told by your doctor. Stop exercising if you start having cramps.  Eat regular, healthy meals.  Wear a good support bra if your breasts are tender.  Do not use hot tubs, steam rooms, or saunas.  Wear your seat belt when driving.  Avoid raw meat, uncooked cheese, and liter boxes  and soil used by cats.  Take your prenatal vitamins.  Take 1500-2000 milligrams of calcium daily starting at the 20th week of pregnancy until you deliver your baby.  Try taking medicine that helps you poop (stool softener) as needed, and if your doctor approves. Eat more fiber by eating fresh fruit, vegetables, and whole grains. Drink enough fluids to keep your pee (urine) clear or pale yellow.  Take warm water baths (sitz baths) to soothe pain or discomfort caused by hemorrhoids. Use hemorrhoid cream if your doctor approves.  If you have puffy, bulging veins (varicose veins), wear support hose. Raise (elevate) your feet for 15 minutes, 3-4 times a day. Limit salt in your diet.  Avoid heavy lifting, wear low heels, and sit up straight.  Rest with your legs raised if you have leg cramps or low back pain.  Visit your dentist if you have not gone during your pregnancy. Use a soft toothbrush to brush your teeth. Be gentle when you floss.  You can have sex (intercourse) unless your doctor tells you not to.  Do not travel far distances unless you must. Only do so with your doctor's approval.  Take prenatal classes.  Practice driving to the hospital.  Pack your hospital bag.  Prepare the baby's room.  Go to your doctor visits. Get help if:  You are not sure if you are in labor or if your water has broken.  You are dizzy.  You have mild cramps or pressure in your lower belly (abdominal).  You have a nagging pain in your belly area.  You continue to feel sick to your stomach (nauseous), throw up (vomit), or have watery poop (diarrhea).  You have bad smelling fluid coming from your vagina.  You have pain with peeing (urination). Get help right away if:  You have a fever.  You are leaking fluid from your vagina.  You are spotting or  bleeding from your vagina.  You have severe belly cramping or pain.  You lose or gain weight rapidly.  You have trouble catching your  breath and have chest pain.  You notice sudden or extreme puffiness (swelling) of your face, hands, ankles, feet, or legs.  You have not felt the baby move in over an hour.  You have severe headaches that do not go away with medicine.  You have vision changes. This information is not intended to replace advice given to you by your health care provider. Make sure you discuss any questions you have with your health care provider. Document Released: 12/26/2009 Document Revised: 03/08/2016 Document Reviewed: 12/02/2012 Elsevier Interactive Patient Education  2017 [employer] Inc[employer]  Vaginal Delivery Vaginal delivery means that you will give birth by pushing your baby out of your birth canal (vagina). A team of health care providers will help you before, during, and after vaginal delivery. Birth experiences are unique for every woman and every pregnancy, and birth experiences vary depending on where you choose to give birth. What should I do to prepare for my baby's birth? Before your baby is born, it is important to talk with your health care provider about:  Your labor and delivery preferences. These may include: ? Medicines that you may be given. ? How you will manage your pain. This might include non-medical pain relief techniques or injectable pain relief such as epidural analgesia. ? How you and your baby will be monitored during labor and delivery. ? Who may be in the labor and delivery room with you. ? Your feelings about surgical delivery of your baby (cesarean delivery, or C-section) if this becomes necessary. ? Your feelings about receiving donated blood through an IV tube (blood transfusion) if this becomes necessary.  Whether you are able: ? To take pictures or videos of the birth. ? To eat during labor and delivery. ? To move around, walk, or change positions during labor and delivery.  What to expect after your baby is born, such as: ? Whether delayed umbilical cord clamping and  cutting is offered. ? Who will care for your baby right after birth. ? Medicines or tests that may be recommended for your baby. ? Whether breastfeeding is supported in your hospital or birth center. ? How long you will be in the hospital or birth center.  How any medical conditions you have may affect your baby or your labor and delivery experience.  To prepare for your baby's birth, you should also:  Attend all of your health care visits before delivery (prenatal visits) as recommended by your health care provider. This is important.  Prepare your home for your baby's arrival. Make sure that you have: ? Diapers. ? Baby clothing. ? Feeding equipment. ? Safe sleeping arrangements for you and your baby.  Install a car seat in your vehicle. Have your car seat checked by a certified car seat installer to make sure that it is installed safely.  Think about who will help you with your new baby at home for at least the first several weeks after delivery.  What can I expect when I arrive at the birth center or hospital? Once you are in labor and have been admitted into the hospital or birth center, your health care provider may:  Review your pregnancy history and any concerns you have.  Insert an IV tube into one of your veins. This is used to give you fluids and medicines.  Check your blood pressure, pulse, temperature,  and heart rate (vital signs).  Check whether your bag of water (amniotic sac) has broken (ruptured).  Talk with you about your birth plan and discuss pain control options.  Monitoring Your health care provider may monitor your contractions (uterine monitoring) and your baby's heart rate (fetal monitoring). You may need to be monitored:  Often, but not continuously (intermittently).  All the time or for long periods at a time (continuously). Continuous monitoring may be needed if: ? You are taking certain medicines, such as medicine to relieve pain or make your  contractions stronger. ? You have pregnancy or labor complications.  Monitoring may be done by:  Placing a special stethoscope or a handheld monitoring device on your abdomen to check your baby's heartbeat, and feeling your abdomen for contractions. This method of monitoring does not continuously record your baby's heartbeat or your contractions.  Placing monitors on your abdomen (external monitors) to record your baby's heartbeat and the frequency and length of contractions. You may not have to wear external monitors all the time.  Placing monitors inside of your uterus (internal monitors) to record your baby's heartbeat and the frequency, length, and strength of your contractions. ? Your health care provider may use internal monitors if he or she needs more information about the strength of your contractions or your baby's heart rate. ? Internal monitors are put in place by passing a thin, flexible wire through your vagina and into your uterus. Depending on the type of monitor, it may remain in your uterus or on your baby's head until birth. ? Your health care provider will discuss the benefits and risks of internal monitoring with you and will ask for your permission before inserting the monitors.  Telemetry. This is a type of continuous monitoring that can be done with external or internal monitors. Instead of having to stay in bed, you are able to move around during telemetry. Ask your health care provider if telemetry is an option for you.  Physical exam Your health care provider may perform a physical exam. This may include:  Checking whether your baby is positioned: ? With the head toward your vagina (head-down). This is most common. ? With the head toward the top of your uterus (head-up or breech). If your baby is in a breech position, your health care provider may try to turn your baby to a head-down position so you can deliver vaginally. If it does not seem that your baby can be born  vaginally, your provider may recommend surgery to deliver your baby. In rare cases, you may be able to deliver vaginally if your baby is head-up (breech delivery). ? Lying sideways (transverse). Babies that are lying sideways cannot be delivered vaginally.  Checking your cervix to determine: ? Whether it is thinning out (effacing). ? Whether it is opening up (dilating). ? How low your baby has moved into your birth canal.  What are the three stages of labor and delivery?  Normal labor and delivery is divided into the following three stages: Stage 1  Stage 1 is the longest stage of labor, and it can last for hours or days. Stage 1 includes: ? Early labor. This is when contractions may be irregular, or regular and mild. Generally, early labor contractions are more than 10 minutes apart. ? Active labor. This is when contractions get longer, more regular, more frequent, and more intense. ? The transition phase. This is when contractions happen very close together, are very intense, and may last longer than  during any other part of labor.  Contractions generally feel mild, infrequent, and irregular at first. They get stronger, more frequent (about every 2-3 minutes), and more regular as you progress from early labor through active labor and transition.  Many women progress through stage 1 naturally, but you may need help to continue making progress. If this happens, your health care provider may talk with you about: ? Rupturing your amniotic sac if it has not ruptured yet. ? Giving you medicine to help make your contractions stronger and more frequent.  Stage 1 ends when your cervix is completely dilated to 4 inches (10 cm) and completely effaced. This happens at the end of the transition phase. Stage 2  Once your cervix is completely effaced and dilated to 4 inches (10 cm), you may start to feel an urge to push. It is common for the body to naturally take a rest before feeling the urge to  push, especially if you received an epidural or certain other pain medicines. This rest period may last for up to 1-2 hours, depending on your unique labor experience.  During stage 2, contractions are generally less painful, because pushing helps relieve contraction pain. Instead of contraction pain, you may feel stretching and burning pain, especially when the widest part of your baby's head passes through the vaginal opening (crowning).  Your health care provider will closely monitor your pushing progress and your baby's progress through the vagina during stage 2.  Your health care provider may massage the area of skin between your vaginal opening and anus (perineum) or apply warm compresses to your perineum. This helps it stretch as the baby's head starts to crown, which can help prevent perineal tearing. ? In some cases, an incision may be made in your perineum (episiotomy) to allow the baby to pass through the vaginal opening. An episiotomy helps to make the opening of the vagina larger to allow more room for the baby to fit through.  It is very important to breathe and focus so your health care provider can control the delivery of your baby's head. Your health care provider may have you decrease the intensity of your pushing, to help prevent perineal tearing.  After delivery of your baby's head, the shoulders and the rest of the body generally deliver very quickly and without difficulty.  Once your baby is delivered, the umbilical cord may be cut right away, or this may be delayed for 1-2 minutes, depending on your baby's health. This may vary among health care providers, hospitals, and birth centers.  If you and your baby are healthy enough, your baby may be placed on your chest or abdomen to help maintain the baby's temperature and to help you bond with each other. Some mothers and babies start breastfeeding at this time. Your health care team will dry your baby and help keep your baby warm  during this time.  Your baby may need immediate care if he or she: ? Showed signs of distress during labor. ? Has a medical condition. ? Was born too early (prematurely). ? Had a bowel movement before birth (meconium). ? Shows signs of difficulty transitioning from being inside the uterus to being outside of the uterus. If you are planning to breastfeed, your health care team will help you begin a feeding. Stage 3  The third stage of labor starts immediately after the birth of your baby and ends after you deliver the placenta. The placenta is an organ that develops during pregnancy to  provide oxygen and nutrients to your baby in the womb.  Delivering the placenta may require some pushing, and you may have mild contractions. Breastfeeding can stimulate contractions to help you deliver the placenta.  After the placenta is delivered, your uterus should tighten (contract) and become firm. This helps to stop bleeding in your uterus. To help your uterus contract and to control bleeding, your health care provider may: ? Give you medicine by injection, through an IV tube, by mouth, or through your rectum (rectally). ? Massage your abdomen or perform a vaginal exam to remove any blood clots that are left in your uterus. ? Empty your bladder by placing a thin, flexible tube (catheter) into your bladder. ? Encourage you to breastfeed your baby. After labor is over, you and your baby will be monitored closely to ensure that you are both healthy until you are ready to go home. Your health care team will teach you how to care for yourself and your baby. This information is not intended to replace advice given to you by your health care provider. Make sure you discuss any questions you have with your health care provider. Document Released: 07/10/2008 Document Revised: 04/20/2016 Document Reviewed: 10/16/2015 Elsevier Interactive Patient Education  2018 [employer].

## 2018-02-27 NOTE — Progress Notes (Addendum)
   PRENATAL VISIT NOTE  Subjective:  Shelley [name] is a 27 y.o. G2P1001 at [redacted]w[redacted]d being seen today for ongoing prenatal care.  She is currently monitored for the following issues for this low-risk pregnancy and has Supervision of normal pregnancy, antepartum; Vitamin D deficiency; HPV in female; and Anemia during pregnancy in third trimester on their problem list.  Patient reports no bleeding, no leaking, occasional contractions and reports several contractions during the day with increased pelvic pressure since Sunday.  Contractions: Irritability.  .  Movement: Present. Denies leaking of fluid.   The following portions of the patient's history were reviewed and updated as appropriate: allergies, current medications, past family history, past medical history, past social history, past surgical history and problem list. Problem list updated.  Objective:   Vitals:   02/27/18 0856  BP: 122/79  Pulse: 87  Weight: 147 lb (66.7 kg)    Fetal Status: Fetal Heart Rate (bpm): NST; reactive Fundal Height: 32 cm Movement: Present  Presentation: Vertex  General:  Alert, oriented and cooperative. Patient is in no acute distress.  Skin: Skin is warm and dry. No rash noted.   Cardiovascular: Normal heart rate noted  Respiratory: Normal respiratory effort, no problems with respiration noted  Abdomen: Soft, gravid, appropriate for gestational age.  Pain/Pressure: Present     Pelvic: Cervical exam performed Dilation: Closed Effacement (%): 20 Station: -3, posterior, soft  Extremities: Normal range of motion.     Mental Status: Normal mood and affect. Normal behavior. Normal judgment and thought content.  NST: + accels, no decels, moderate variability, Cat. 1 tracing. 2  contractions on toco iin 30 + minutes: paper stopped tracing.   Assessment and Plan:  Pregnancy: G2P1001 at [redacted]w[redacted]d  1. Supervision of other normal pregnancy, antepartum     Reactive NST.  Preterm contractions noted.  Increased PO  water intake encouraged.  Not currently working.    2. Vitamin D deficiency     Taking weekly vitamin D.   3. Anemia during pregnancy in third trimester     Taking Bloom.  HgB: 10.5  4. Pelvic pressure in pregnancy, antepartum, third trimester      - Elastic Bandages & Supports (COMFORT FIT MATERNITY SUPP LG) MISC; 1 Units by Does not apply route daily.  Dispense: 1 each; Refill: 0 - Fetal fibronectin - Cervicovaginal ancillary only  5. Preterm uterine contractions in third trimester, antepartum     Possible BMZ discussed.  Strict preterm labor signs/symptoms discussed.  - Fetal fibronectin - Cervicovaginal ancillary only  Preterm labor symptoms and general obstetric precautions including but not limited to vaginal bleeding, contractions, leaking of fluid and fetal movement were reviewed in detail with the patient. Please refer to After Visit Summary for other counseling recommendations.  Return in about 2 weeks (around 03/13/2018) for ROB, GBS.  No future appointments.  Roe Coombs, CNM

## 2018-02-28 LAB — CERVICOVAGINAL ANCILLARY ONLY
BACTERIAL VAGINITIS: NEGATIVE
CANDIDA VAGINITIS: NEGATIVE
Chlamydia: NEGATIVE
Neisseria Gonorrhea: NEGATIVE
Trichomonas: NEGATIVE

## 2018-03-03 ENCOUNTER — Encounter: Payer: Self-pay | Admitting: *Deleted

## 2018-03-13 ENCOUNTER — Ambulatory Visit (INDEPENDENT_AMBULATORY_CARE_PROVIDER_SITE_OTHER): Payer: Medicaid Other | Admitting: Certified Nurse Midwife

## 2018-03-13 ENCOUNTER — Other Ambulatory Visit (HOSPITAL_COMMUNITY)
Admission: RE | Admit: 2018-03-13 | Discharge: 2018-03-13 | Disposition: A | Discharge status: Home / Self Care | Payer: Medicaid Other | Source: Ambulatory Visit | Attending: Certified Nurse Midwife | Admitting: Certified Nurse Midwife

## 2018-03-13 ENCOUNTER — Encounter: Payer: Self-pay | Admitting: Certified Nurse Midwife

## 2018-03-13 VITALS — BP 122/83 | HR 83 | Wt 148.1 lb

## 2018-03-13 DIAGNOSIS — Z3480 Encounter for supervision of other normal pregnancy, unspecified trimester: Secondary | ICD-10-CM

## 2018-03-13 DIAGNOSIS — E559 Vitamin D deficiency, unspecified: Secondary | ICD-10-CM

## 2018-03-13 DIAGNOSIS — O99013 Anemia complicating pregnancy, third trimester: Secondary | ICD-10-CM

## 2018-03-13 DIAGNOSIS — Z3483 Encounter for supervision of other normal pregnancy, third trimester: Secondary | ICD-10-CM | POA: Diagnosis not present

## 2018-03-13 LAB — OB RESULTS CONSOLE GC/CHLAMYDIA: Gonorrhea: NEGATIVE

## 2018-03-13 NOTE — Progress Notes (Signed)
   PRENATAL VISIT NOTE  Subjective:  Shelley [name] is a 27 y.o. G2P1001 at [redacted]w[redacted]d being seen today for ongoing prenatal care.  She is currently monitored for the following issues for this low-risk pregnancy and has Supervision of normal pregnancy, antepartum; Vitamin D deficiency; HPV in female; and Anemia during pregnancy in third trimester on their problem list.  Patient reports no bleeding, no leaking and occasional contractions.  Contractions: Irritability. Vag. Bleeding: None.  Movement: Present. Denies leaking of fluid.   The following portions of the patient's history were reviewed and updated as appropriate: allergies, current medications, past family history, past medical history, past social history, past surgical history and problem list. Problem list updated.  Objective:   Vitals:   03/13/18 0831  BP: 122/83  Pulse: 83  Weight: 148 lb 1.6 oz (67.2 kg)    Fetal Status: Fetal Heart Rate (bpm): 131; doppler Fundal Height: 35 cm Movement: Present  Presentation: Vertex  General:  Alert, oriented and cooperative. Patient is in no acute distress.  Skin: Skin is warm and dry. No rash noted.   Cardiovascular: Normal heart rate noted  Respiratory: Normal respiratory effort, no problems with respiration noted  Abdomen: Soft, gravid, appropriate for gestational age.  Pain/Pressure: Present     Pelvic: Cervical exam performed Dilation: Closed Effacement (%): Thick Station: Ballotable  Extremities: Normal range of motion.  Edema: None  Mental Status: Normal mood and affect. Normal behavior. Normal judgment and thought content.   Assessment and Plan:  Pregnancy: G2P1001 at [redacted]w[redacted]d  1. Supervision of other normal pregnancy, antepartum     Doing well.  Normal discomforts of pregnancy discussed. Cultures today.   2. Vitamin D deficiency     Taking weekly vitamin D  3. Anemia during pregnancy in third trimester     Stable, taking iron.   Preterm labor symptoms and general obstetric  precautions including but not limited to vaginal bleeding, contractions, leaking of fluid and fetal movement were reviewed in detail with the patient. Please refer to After Visit Summary for other counseling recommendations.  Return in about 1 week (around 03/20/2018) for ROB.  No future appointments.  Roe Coombs, CNM

## 2018-03-13 NOTE — Patient Instructions (Signed)
Before Baby Comes Home Before your baby arrives it is important to:  Have all of the supplies that you will need to care for your baby.  Know where to go if there is an emergency.  Discuss the baby's arrival with other family members.  What supplies will I need?  It is recommended that you have the following supplies: Large Items  Crib.  Crib mattress.  Rear-facing infant car seat. If possible, have a trained professional check to make sure that it is installed correctly.  Feeding  6-8 bottles that are 4-5 oz in size.  6-8 nipples.  Bottle brush.  Sterilizer, or a large pan or kettle with a lid.  A way to boil and cool water.  If you will be breastfeeding: ? Breast pump. ? Nipple cream. ? Nursing bra. ? Breast pads. ? Breast shields.  If you will be formula feeding: ? Formula. ? Measuring cups. ? Measuring spoons.  Bathing  Mild baby soap and baby shampoo.  Petroleum jelly.  Soft cloth towel and washcloth.  Hooded towel.  Cotton balls.  Bath basin.  Other Supplies  Rectal thermometer.  Bulb syringe.  Baby wipes or washcloths for diaper changes.  Diaper bag.  Changing pad.  Clothing, including one-piece outfits and pajamas.  Baby nail clippers.  Receiving blankets.  Mattress pad and sheets for the crib.  Night-light for the baby's room.  Baby monitor.  2 or 3 pacifiers.  Either 24-36 cloth diapers and waterproof diaper covers or a box of disposable diapers. You may need to use as many as 10-12 diapers per day.  How do I prepare for an emergency? Prepare for an emergency by:  Knowing how to get to the nearest hospital.  Listing the phone numbers of your baby's health care providers near your home phone and in your cell phone.  How do I prepare my family?  Decide how to handle visitors.  If you have other children: ? Talk with them about the baby coming home. Ask them how they feel about it. ? Read a book together about  being a new big brother or sister. ? Find ways to let them help you prepare for the new baby. ? Have someone ready to care for them while you are in the hospital. This information is not intended to replace advice given to you by your health care provider. Make sure you discuss any questions you have with your health care provider. Document Released: 09/13/2008 Document Revised: 03/08/2016 Document Reviewed: 09/08/2014 Elsevier Interactive Patient Education  2018 [employer]  Braxton Hicks Contractions Contractions of the uterus can occur throughout pregnancy, but they are not always a sign that you are in labor. You may have practice contractions called Braxton Hicks contractions. These false labor contractions are sometimes confused with true labor. What are Braxton Hicks contractions? Braxton Hicks contractions are tightening movements that occur in the muscles of the uterus before labor. Unlike true labor contractions, these contractions do not result in opening (dilation) and thinning of the cervix. Toward the end of pregnancy (32-34 weeks), Braxton Hicks contractions can happen more often and may become stronger. These contractions are sometimes difficult to tell apart from true labor because they can be very uncomfortable. You should not feel embarrassed if you go to the hospital with false labor. Sometimes, the only way to tell if you are in true labor is for your health care provider to look for changes in the cervix. The health care provider will do a   physical exam and may monitor your contractions. If you are not in true labor, the exam should show that your cervix is not dilating and your water has not broken. If there are other health problems associated with your pregnancy, it is completely safe for you to be sent home with false labor. You may continue to have Braxton Hicks contractions until you go into true labor. How to tell the difference between true labor and false labor True  labor  Contractions last 30-70 seconds.  Contractions become very regular.  Discomfort is usually felt in the top of the uterus, and it spreads to the lower abdomen and low back.  Contractions do not go away with walking.  Contractions usually become more intense and increase in frequency.  The cervix dilates and gets thinner. False labor  Contractions are usually shorter and not as strong as true labor contractions.  Contractions are usually irregular.  Contractions are often felt in the front of the lower abdomen and in the groin.  Contractions may go away when you walk around or change positions while lying down.  Contractions get weaker and are shorter-lasting as time goes on.  The cervix usually does not dilate or become thin. Follow these instructions at home:  Take over-the-counter and prescription medicines only as told by your health care provider.  Keep up with your usual exercises and follow other instructions from your health care provider.  Eat and drink lightly if you think you are going into labor.  If Braxton Hicks contractions are making you uncomfortable: ? Change your position from lying down or resting to walking, or change from walking to resting. ? Sit and rest in a tub of warm water. ? Drink enough fluid to keep your urine pale yellow. Dehydration may cause these contractions. ? Do slow and deep breathing several times an hour.  Keep all follow-up prenatal visits as told by your health care provider. This is important. Contact a health care provider if:  You have a fever.  You have continuous pain in your abdomen. Get help right away if:  Your contractions become stronger, more regular, and closer together.  You have fluid leaking or gushing from your vagina.  You pass blood-tinged mucus (bloody show).  You have bleeding from your vagina.  You have low back pain that you never had before.  You feel your baby's head pushing down and  causing pelvic pressure.  Your baby is not moving inside you as much as it used to. Summary  Contractions that occur before labor are called Braxton Hicks contractions, false labor, or practice contractions.  Braxton Hicks contractions are usually shorter, weaker, farther apart, and less regular than true labor contractions. True labor contractions usually become progressively stronger and regular and they become more frequent.  Manage discomfort from Braxton Hicks contractions by changing position, resting in a warm bath, drinking plenty of water, or practicing deep breathing. This information is not intended to replace advice given to you by your health care provider. Make sure you discuss any questions you have with your health care provider. Document Released: 02/14/2017 Document Revised: 02/14/2017 Document Reviewed: 02/14/2017 Elsevier Interactive Patient Education  2018 [employer]  Third Trimester of Pregnancy The third trimester is from week 29 through week 42, months 7 through 9. This trimester is when your unborn baby (fetus) is growing very fast. At the end of the ninth month, the unborn baby is about 20 inches in length. It weighs about 6-10 pounds. Follow   these instructions at home:  Avoid all smoking, herbs, and alcohol. Avoid drugs not approved by your doctor.  Do not use any tobacco products, including cigarettes, chewing tobacco, and electronic cigarettes. If you need help quitting, ask your doctor. You may get counseling or other support to help you quit.  Only take medicine as told by your doctor. Some medicines are safe and some are not during pregnancy.  Exercise only as told by your doctor. Stop exercising if you start having cramps.  Eat regular, healthy meals.  Wear a good support bra if your breasts are tender.  Do not use hot tubs, steam rooms, or saunas.  Wear your seat belt when driving.  Avoid raw meat, uncooked cheese, and liter boxes and soil used  by cats.  Take your prenatal vitamins.  Take 1500-2000 milligrams of calcium daily starting at the 20th week of pregnancy until you deliver your baby.  Try taking medicine that helps you poop (stool softener) as needed, and if your doctor approves. Eat more fiber by eating fresh fruit, vegetables, and whole grains. Drink enough fluids to keep your pee (urine) clear or pale yellow.  Take warm water baths (sitz baths) to soothe pain or discomfort caused by hemorrhoids. Use hemorrhoid cream if your doctor approves.  If you have puffy, bulging veins (varicose veins), wear support hose. Raise (elevate) your feet for 15 minutes, 3-4 times a day. Limit salt in your diet.  Avoid heavy lifting, wear low heels, and sit up straight.  Rest with your legs raised if you have leg cramps or low back pain.  Visit your dentist if you have not gone during your pregnancy. Use a soft toothbrush to brush your teeth. Be gentle when you floss.  You can have sex (intercourse) unless your doctor tells you not to.  Do not travel far distances unless you must. Only do so with your doctor's approval.  Take prenatal classes.  Practice driving to the hospital.  Pack your hospital bag.  Prepare the baby's room.  Go to your doctor visits. Get help if:  You are not sure if you are in labor or if your water has broken.  You are dizzy.  You have mild cramps or pressure in your lower belly (abdominal).  You have a nagging pain in your belly area.  You continue to feel sick to your stomach (nauseous), throw up (vomit), or have watery poop (diarrhea).  You have bad smelling fluid coming from your vagina.  You have pain with peeing (urination). Get help right away if:  You have a fever.  You are leaking fluid from your vagina.  You are spotting or bleeding from your vagina.  You have severe belly cramping or pain.  You lose or gain weight rapidly.  You have trouble catching your breath and have  chest pain.  You notice sudden or extreme puffiness (swelling) of your face, hands, ankles, feet, or legs.  You have not felt the baby move in over an hour.  You have severe headaches that do not go away with medicine.  You have vision changes. This information is not intended to replace advice given to you by your health care provider. Make sure you discuss any questions you have with your health care provider. Document Released: 12/26/2009 Document Revised: 03/08/2016 Document Reviewed: 12/02/2012 Elsevier Interactive Patient Education  2017 [employer]  

## 2018-03-14 LAB — GC/CHLAMYDIA PROBE AMP (~~LOC~~) NOT AT ARMC
Chlamydia: NEGATIVE
Neisseria Gonorrhea: NEGATIVE

## 2018-03-15 LAB — STREP GP B NAA: Strep Gp B NAA: NEGATIVE

## 2018-03-18 ENCOUNTER — Other Ambulatory Visit: Payer: Self-pay | Admitting: Certified Nurse Midwife

## 2018-03-18 DIAGNOSIS — Z3480 Encounter for supervision of other normal pregnancy, unspecified trimester: Secondary | ICD-10-CM

## 2018-03-20 ENCOUNTER — Ambulatory Visit (INDEPENDENT_AMBULATORY_CARE_PROVIDER_SITE_OTHER): Payer: Medicaid Other | Admitting: Certified Nurse Midwife

## 2018-03-20 ENCOUNTER — Encounter: Payer: Self-pay | Admitting: Certified Nurse Midwife

## 2018-03-20 VITALS — BP 132/82 | HR 80 | Wt 150.0 lb

## 2018-03-20 DIAGNOSIS — Z3480 Encounter for supervision of other normal pregnancy, unspecified trimester: Secondary | ICD-10-CM

## 2018-03-20 DIAGNOSIS — O99013 Anemia complicating pregnancy, third trimester: Secondary | ICD-10-CM

## 2018-03-20 DIAGNOSIS — J301 Allergic rhinitis due to pollen: Secondary | ICD-10-CM

## 2018-03-20 DIAGNOSIS — E559 Vitamin D deficiency, unspecified: Secondary | ICD-10-CM

## 2018-03-20 MED ORDER — FLUTICASONE PROPIONATE 50 MCG/ACT NA SUSP: 2.0000 | Freq: Every day | NASAL | 2 refills | Status: DC

## 2018-03-20 NOTE — Progress Notes (Signed)
   PRENATAL VISIT NOTE  Subjective:  Shelley [name] is a 27 y.o. G2P1001 at 5860w5d being seen today for ongoing prenatal care.  She is currently monitored for the following issues for this low-risk pregnancy and has Supervision of normal pregnancy, antepartum; Vitamin D deficiency; HPV in female; and Anemia during pregnancy in third trimester on their problem list.  Patient reports no bleeding, no contractions, no cramping, no leaking and nasal congestion: has been using Afrin, encouraged to stop using..  Contractions: Not present. Vag. Bleeding: None.  Movement: Present. Denies leaking of fluid.   The following portions of the patient's history were reviewed and updated as appropriate: allergies, current medications, past family history, past medical history, past social history, past surgical history and problem list. Problem list updated.  Objective:   Vitals:   03/20/18 0952  BP: 132/82  Pulse: 80  Weight: 150 lb (68 kg)    Fetal Status: Fetal Heart Rate (bpm): 135; doppler Fundal Height: 34 cm Movement: Present     General:  Alert, oriented and cooperative. Patient is in no acute distress.  Skin: Skin is warm and dry. No rash noted.   Cardiovascular: Normal heart rate noted  Respiratory: Normal respiratory effort, no problems with respiration noted  Abdomen: Soft, gravid, appropriate for gestational age.  Pain/Pressure: Absent     Pelvic: Cervical exam deferred        Extremities: Normal range of motion.  Edema: None  Mental Status: Normal mood and affect. Normal behavior. Normal judgment and thought content.   Assessment and Plan:  Pregnancy: G2P1001 at 1460w5d  1. Supervision of other normal pregnancy, antepartum      Doing well overall.    2. Vitamin D deficiency     Taking weekly vitamin D.   3. Anemia during pregnancy in third trimester    HGB: 10.5.  Taking Bloom  4. Seasonal allergic rhinitis due to pollen      OTC medications discussed.  - fluticasone (FLONASE)  50 MCG/ACT nasal spray; Place 2 sprays into both nostrils daily.  Dispense: 16 g; Refill: 2  Preterm labor symptoms and general obstetric precautions including but not limited to vaginal bleeding, contractions, leaking of fluid and fetal movement were reviewed in detail with the patient. Please refer to After Visit Summary for other counseling recommendations.  Return in about 1 week (around 03/27/2018) for ROB.  No future appointments.  Roe Coombsachelle A Tiffanyann Deroo, CNM

## 2018-03-27 ENCOUNTER — Ambulatory Visit (INDEPENDENT_AMBULATORY_CARE_PROVIDER_SITE_OTHER): Payer: Medicaid Other | Admitting: Certified Nurse Midwife

## 2018-03-27 ENCOUNTER — Encounter: Payer: Self-pay | Admitting: Certified Nurse Midwife

## 2018-03-27 VITALS — BP 118/84 | HR 93 | Wt 151.4 lb

## 2018-03-27 DIAGNOSIS — Z3480 Encounter for supervision of other normal pregnancy, unspecified trimester: Secondary | ICD-10-CM

## 2018-03-27 DIAGNOSIS — O99013 Anemia complicating pregnancy, third trimester: Secondary | ICD-10-CM

## 2018-03-27 DIAGNOSIS — E559 Vitamin D deficiency, unspecified: Secondary | ICD-10-CM

## 2018-03-27 NOTE — Progress Notes (Signed)
   PRENATAL VISIT NOTE  Subjective:  Shelley [name] is a 27 y.o. G2P1001 at 4435w5d being seen today for ongoing prenatal care.  She is currently monitored for the following issues for this low-risk pregnancy and has Supervision of normal pregnancy, antepartum; Vitamin D deficiency; HPV in female; and Anemia during pregnancy in third trimester on their problem list.  Patient reports no bleeding, no leaking, occasional contractions and nasal congestion, has not tried anything besides Afrin, OTC medications discussed.  Contractions: Irregular. Vag. Bleeding: None.  Movement: Present. Denies leaking of fluid.   The following portions of the patient's history were reviewed and updated as appropriate: allergies, current medications, past family history, past medical history, past social history, past surgical history and problem list. Problem list updated.  Objective:   Vitals:   03/27/18 0831  BP: 118/84  Pulse: 93  Weight: 151 lb 6.4 oz (68.7 kg)    Fetal Status: Fetal Heart Rate (bpm): 143; doppler Fundal Height: 35 cm Movement: Present     General:  Alert, oriented and cooperative. Patient is in no acute distress.  Skin: Skin is warm and dry. No rash noted.   Cardiovascular: Normal heart rate noted  Respiratory: Normal respiratory effort, no problems with respiration noted  Abdomen: Soft, gravid, appropriate for gestational age.  Pain/Pressure: Present     Pelvic: Cervical exam deferred        Extremities: Normal range of motion.  Edema: None  Mental Status: Normal mood and affect. Normal behavior. Normal judgment and thought content.   Assessment and Plan:  Pregnancy: G2P1001 at 3935w5d  1. Supervision of other normal pregnancy, antepartum     Normal discomforts of pregnancy  2. Vitamin D deficiency     Taking weekly vitamin D  3. Anemia during pregnancy in third trimester     Stable, taking iron supplementation.  Term labor symptoms and general obstetric precautions including  but not limited to vaginal bleeding, contractions, leaking of fluid and fetal movement were reviewed in detail with the patient. Please refer to After Visit Summary for other counseling recommendations.  Return in about 1 week (around 04/03/2018) for ROB.  Future Appointments  Date Time Provider Department Center  04/03/2018  8:45 AM Roe Coombsenney, Namita Yearwood A, CNM [facility] None    Roe Coombsachelle A Hanifah Royse, CNM

## 2018-03-27 NOTE — Patient Instructions (Addendum)
Braxton Hicks Contractions °Contractions of the uterus can occur throughout pregnancy, but they are not always a sign that you are in labor. You may have practice contractions called Braxton Hicks contractions. These false labor contractions are sometimes confused with true labor. °What are Braxton Hicks contractions? °Braxton Hicks contractions are tightening movements that occur in the muscles of the uterus before labor. Unlike true labor contractions, these contractions do not result in opening (dilation) and thinning of the cervix. Toward the end of pregnancy (32-34 weeks), Braxton Hicks contractions can happen more often and may become stronger. These contractions are sometimes difficult to tell apart from true labor because they can be very uncomfortable. You should not feel embarrassed if you go to the hospital with false labor. °Sometimes, the only way to tell if you are in true labor is for your health care provider to look for changes in the cervix. The health care provider will do a physical exam and may monitor your contractions. If you are not in true labor, the exam should show that your cervix is not dilating and your water has not broken. °If there are other health problems associated with your pregnancy, it is completely safe for you to be sent home with false labor. You may continue to have Braxton Hicks contractions until you go into true labor. °How to tell the difference between true labor and false labor °True labor °· Contractions last 30-70 seconds. °· Contractions become very regular. °· Discomfort is usually felt in the top of the uterus, and it spreads to the lower abdomen and low back. °· Contractions do not go away with walking. °· Contractions usually become more intense and increase in frequency. °· The cervix dilates and gets thinner. °False labor °· Contractions are usually shorter and not as strong as true labor contractions. °· Contractions are usually irregular. °· Contractions  are often felt in the front of the lower abdomen and in the groin. °· Contractions may go away when you walk around or change positions while lying down. °· Contractions get weaker and are shorter-lasting as time goes on. °· The cervix usually does not dilate or become thin. °Follow these instructions at home: °· Take over-the-counter and prescription medicines only as told by your health care provider. °· Keep up with your usual exercises and follow other instructions from your health care provider. °· Eat and drink lightly if you think you are going into labor. °· If Braxton Hicks contractions are making you uncomfortable: °? Change your position from lying down or resting to walking, or change from walking to resting. °? Sit and rest in a tub of warm water. °? Drink enough fluid to keep your urine pale yellow. Dehydration may cause these contractions. °? Do slow and deep breathing several times an hour. °· Keep all follow-up prenatal visits as told by your health care provider. This is important. °Contact a health care provider if: °· You have a fever. °· You have continuous pain in your abdomen. °Get help right away if: °· Your contractions become stronger, more regular, and closer together. °· You have fluid leaking or gushing from your vagina. °· You pass blood-tinged mucus (bloody show). °· You have bleeding from your vagina. °· You have low back pain that you never had before. °· You feel your baby’s head pushing down and causing pelvic pressure. °· Your baby is not moving inside you as much as it used to. °Summary °· Contractions that occur before labor are called Braxton   Hicks contractions, false labor, or practice contractions. °· Braxton Hicks contractions are usually shorter, weaker, farther apart, and less regular than true labor contractions. True labor contractions usually become progressively stronger and regular and they become more frequent. °· Manage discomfort from Braxton Hicks contractions by  changing position, resting in a warm bath, drinking plenty of water, or practicing deep breathing. °This information is not intended to replace advice given to you by your health care provider. Make sure you discuss any questions you have with your health care provider. °Document Released: 02/14/2017 Document Revised: 02/14/2017 Document Reviewed: 02/14/2017 °Elsevier Interactive Patient Education © 2018 [employer] ° °Third Trimester of Pregnancy °The third trimester is from week 29 through week 42, months 7 through 9. This trimester is when your unborn baby (fetus) is growing very fast. At the end of the ninth month, the unborn baby is about 20 inches in length. It weighs about 6-10 pounds. °Follow these instructions at home: °· Avoid all smoking, herbs, and alcohol. Avoid drugs not approved by your doctor. °· Do not use any tobacco products, including cigarettes, chewing tobacco, and electronic cigarettes. If you need help quitting, ask your doctor. You may get counseling or other support to help you quit. °· Only take medicine as told by your doctor. Some medicines are safe and some are not during pregnancy. °· Exercise only as told by your doctor. Stop exercising if you start having cramps. °· Eat regular, healthy meals. °· Wear a good support bra if your breasts are tender. °· Do not use hot tubs, steam rooms, or saunas. °· Wear your seat belt when driving. °· Avoid raw meat, uncooked cheese, and liter boxes and soil used by cats. °· Take your prenatal vitamins. °· Take 1500-2000 milligrams of calcium daily starting at the 20th week of pregnancy until you deliver your baby. °· Try taking medicine that helps you poop (stool softener) as needed, and if your doctor approves. Eat more fiber by eating fresh fruit, vegetables, and whole grains. Drink enough fluids to keep your pee (urine) clear or pale yellow. °· Take warm water baths (sitz baths) to soothe pain or discomfort caused by hemorrhoids. Use hemorrhoid  cream if your doctor approves. °· If you have puffy, bulging veins (varicose veins), wear support hose. Raise (elevate) your feet for 15 minutes, 3-4 times a day. Limit salt in your diet. °· Avoid heavy lifting, wear low heels, and sit up straight. °· Rest with your legs raised if you have leg cramps or low back pain. °· Visit your dentist if you have not gone during your pregnancy. Use a soft toothbrush to brush your teeth. Be gentle when you floss. °· You can have sex (intercourse) unless your doctor tells you not to. °· Do not travel far distances unless you must. Only do so with your doctor's approval. °· Take prenatal classes. °· Practice driving to the hospital. °· Pack your hospital bag. °· Prepare the baby's room. °· Go to your doctor visits. °Get help if: °· You are not sure if you are in labor or if your water has broken. °· You are dizzy. °· You have mild cramps or pressure in your lower belly (abdominal). °· You have a nagging pain in your belly area. °· You continue to feel sick to your stomach (nauseous), throw up (vomit), or have watery poop (diarrhea). °· You have bad smelling fluid coming from your vagina. °· You have pain with peeing (urination). °Get help right away if: °·   You have a fever. °· You are leaking fluid from your vagina. °· You are spotting or bleeding from your vagina. °· You have severe belly cramping or pain. °· You lose or gain weight rapidly. °· You have trouble catching your breath and have chest pain. °· You notice sudden or extreme puffiness (swelling) of your face, hands, ankles, feet, or legs. °· You have not felt the baby move in over an hour. °· You have severe headaches that do not go away with medicine. °· You have vision changes. °This information is not intended to replace advice given to you by your health care provider. Make sure you discuss any questions you have with your health care provider. °Document Released: 12/26/2009 Document Revised: 03/08/2016 Document  Reviewed: 12/02/2012 °Elsevier Interactive Patient Education © 2017 [employer] ° °

## 2018-03-27 NOTE — Progress Notes (Signed)
Patient reports good fetal movement with some irregular contractions. 

## 2018-04-03 ENCOUNTER — Ambulatory Visit (INDEPENDENT_AMBULATORY_CARE_PROVIDER_SITE_OTHER): Payer: Medicaid Other | Admitting: Certified Nurse Midwife

## 2018-04-03 ENCOUNTER — Encounter (HOSPITAL_COMMUNITY): Payer: Self-pay

## 2018-04-03 ENCOUNTER — Inpatient Hospital Stay (EMERGENCY_DEPARTMENT_HOSPITAL)
Admission: AD | Admit: 2018-04-03 | Discharge: 2018-04-03 | Disposition: A | Discharge status: Home / Self Care | Payer: Medicaid Other | Source: Ambulatory Visit | Attending: Obstetrics & Gynecology | Admitting: Obstetrics & Gynecology

## 2018-04-03 VITALS — BP 147/99 | HR 72 | Wt 156.0 lb

## 2018-04-03 DIAGNOSIS — Z3480 Encounter for supervision of other normal pregnancy, unspecified trimester: Secondary | ICD-10-CM

## 2018-04-03 DIAGNOSIS — O163 Unspecified maternal hypertension, third trimester: Secondary | ICD-10-CM

## 2018-04-03 DIAGNOSIS — Z3A38 38 weeks gestation of pregnancy: Secondary | ICD-10-CM

## 2018-04-03 DIAGNOSIS — Z3689 Encounter for other specified antenatal screening: Secondary | ICD-10-CM

## 2018-04-03 LAB — COMPREHENSIVE METABOLIC PANEL
ALT: 18 U/L (ref 14–54)
AST: 23 U/L (ref 15–41)
Albumin: 3 g/dL — ABNORMAL LOW (ref 3.5–5.0)
Alkaline Phosphatase: 270 U/L — ABNORMAL HIGH (ref 38–126)
Anion gap: 9 (ref 5–15)
BUN: 12 mg/dL (ref 6–20)
CALCIUM: 9 mg/dL (ref 8.9–10.3)
CHLORIDE: 105 mmol/L (ref 101–111)
CO2: 21 mmol/L — AB (ref 22–32)
Creatinine, Ser: 0.84 mg/dL (ref 0.44–1.00)
GFR calc non Af Amer: >60 mL/min (ref >60)
Glucose, Bld: 75 mg/dL (ref 65–99)
Potassium: 4 mmol/L (ref 3.5–5.1)
SODIUM: 135 mmol/L (ref 135–145)
Total Bilirubin: 0.7 mg/dL (ref 0.3–1.2)
Total Protein: 7.4 g/dL (ref 6.5–8.1)

## 2018-04-03 LAB — CBC
HCT: 31.4 % — ABNORMAL LOW (ref 36.0–46.0)
Hemoglobin: 10.6 g/dL — ABNORMAL LOW (ref 12.0–15.0)
MCH: 29.5 pg (ref 26.0–34.0)
MCHC: 33.8 g/dL (ref 30.0–36.0)
MCV: 87.5 fL (ref 78.0–100.0)
PLATELETS: 215 10*3/uL (ref 150–400)
RBC: 3.59 MIL/uL — ABNORMAL LOW (ref 3.87–5.11)
RDW: 12.8 % (ref 11.5–15.5)
WBC: 7.2 10*3/uL (ref 4.0–10.5)

## 2018-04-03 LAB — URINALYSIS, ROUTINE W REFLEX MICROSCOPIC
BILIRUBIN URINE: NEGATIVE
GLUCOSE, UA: NEGATIVE mg/dL
Hgb urine dipstick: NEGATIVE
KETONES UR: NEGATIVE mg/dL
Nitrite: NEGATIVE
PH: 6 (ref 5.0–8.0)
Protein, ur: NEGATIVE mg/dL
Specific Gravity, Urine: 1.02 (ref 1.005–1.030)

## 2018-04-03 LAB — PROTEIN / CREATININE RATIO, URINE
Creatinine, Urine: 267 mg/dL
Protein Creatinine Ratio: 0.07 mg/mg{Cre} (ref 0.00–0.15)
TOTAL PROTEIN, URINE: 20 mg/dL

## 2018-04-03 MED ORDER — ACETAMINOPHEN 500 MG PO TABS
1000.0000 mg | ORAL_TABLET | Freq: Four times a day (QID) | ORAL | Status: DC | PRN
  Administered 2018-04-03: 1000 mg via ORAL
  Filled 2018-04-03: qty 2

## 2018-04-03 NOTE — MAU Provider Note (Addendum)
History     CSN: [id number]  Arrival date and time: 04/03/18 1006   First Provider Initiated Contact with Patient 04/03/18 1050     G2P1001 @38 .5 wks sent from office for elevated BP and HA. Reports frontal HA since last night. Rates 5/10. Has not used anything for it. Denies visual disturbances and epigastric pain. Reports good FM. No VB, LOF, or ctx.   OB History    Gravida  2   Para  1   Term  1   Preterm  0   AB  0   Living  1     SAB  0   TAB  0   Ectopic  0   Multiple  0   Live Births  1           Past Medical History:  Diagnosis Date  . Medical history non-contributory     Past Surgical History:  Procedure Laterality Date  . WISDOM TOOTH EXTRACTION      Family History  Problem Relation Age of Onset  . Diabetes Maternal Grandmother   . Hypertension Maternal Grandmother   . Asthma Maternal Grandmother     Social History   Tobacco Use  . Smoking status: Never Smoker  . Smokeless tobacco: Never Used  Substance Use Topics  . Alcohol use: No  . Drug use: No    Types: Marijuana    Comment: not since pregnancy    Allergies: No Known Allergies  Medications Prior to Admission  Medication Sig Dispense Refill Last Dose  . Prenat-FeAsp-Meth-FA-DHA w/o A (PRENATE PIXIE) 10-0.6-0.4-200 MG CAPS Take 1 tablet by mouth daily. 30 capsule 12 04/02/2018 at Unknown time  . Doxylamine-Pyridoxine (DICLEGIS) 10-10 MG TBEC Take 1 tablet with breakfast and lunch.  Take 2 tablets at bedtime. (Patient not taking: Reported on 04/03/2018) 100 tablet 4 Not Taking at Unknown time  . Elastic Bandages & Supports (COMFORT FIT MATERNITY SUPP LG) MISC 1 Units by Does not apply route daily. 1 each 0 Taking  . fluticasone (FLONASE) 50 MCG/ACT nasal spray Place 2 sprays into both nostrils daily. (Patient not taking: Reported on 04/03/2018) 16 g 2 Not Taking at Unknown time    Review of Systems  Eyes: Negative for visual disturbance.  Gastrointestinal: Negative for  abdominal pain.  Genitourinary: Negative for vaginal bleeding.  Neurological: Positive for headaches.   Physical Exam   Blood pressure 129/87, pulse 84, temperature 98.2 F (36.8 C), temperature source Oral, resp. rate 16, weight 150 lb (68 kg), last menstrual period 07/11/2017, SpO2 99 %. Patient Vitals for the past 24 hrs:  BP Temp Temp src Pulse Resp SpO2 Weight  04/03/18 1200 129/87 - - 84 - - -  04/03/18 1145 (!) 136/92 - - 84 - - -  04/03/18 1130 130/89 - - 86 - - -  04/03/18 1115 (!) 128/92 - - 91 - - -  04/03/18 1100 130/90 - - 89 - - -  04/03/18 1045 (!) 126/91 - - 96 - - -  04/03/18 1040 - - - - - 99 % -  04/03/18 1037 137/90 98.2 F (36.8 C) Oral 96 16 - -  04/03/18 1027 - - - - - - 150 lb (68 kg)   Physical Exam  Nursing note and vitals reviewed. Constitutional: She is oriented to person, place, and time. She appears well-developed and well-nourished. No distress.  HENT:  Head: Normocephalic and atraumatic.  Neck: Normal range of motion.  Respiratory: Effort normal. No respiratory distress.  GI: Soft. She exhibits no distension. There is no tenderness.  gravid  Genitourinary:  Genitourinary Comments: VE: 1.5/50/-2  Musculoskeletal: Normal range of motion. She exhibits no edema.  Neurological: She is alert and oriented to person, place, and time.  Skin: Skin is warm and dry.  Psychiatric: She has a normal mood and affect.  EFM: 125 bpm, mod variability, + accels, no decels Toco: rare  Results for orders placed or performed during the hospital encounter of 04/03/18 (from the past 24 hour(s))  Urinalysis, Routine w reflex microscopic     Status: Abnormal   Collection Time: 04/03/18 10:44 AM  Result Value Ref Range   Color, Urine YELLOW YELLOW   APPearance HAZY (A) CLEAR   Specific Gravity, Urine 1.020 1.005 - 1.030   pH 6.0 5.0 - 8.0   Glucose, UA NEGATIVE NEGATIVE mg/dL   Hgb urine dipstick NEGATIVE NEGATIVE   Bilirubin Urine NEGATIVE NEGATIVE   Ketones, ur  NEGATIVE NEGATIVE mg/dL   Protein, ur NEGATIVE NEGATIVE mg/dL   Nitrite NEGATIVE NEGATIVE   Leukocytes, UA MODERATE (A) NEGATIVE   RBC / HPF 0-5 0 - 5 RBC/hpf   WBC, UA 6-10 0 - 5 WBC/hpf   Bacteria, UA FEW (A) NONE SEEN   Squamous Epithelial / LPF 21-50 0 - 5   Mucus PRESENT   Protein / creatinine ratio, urine     Status: None   Collection Time: 04/03/18 10:47 AM  Result Value Ref Range   Creatinine, Urine 267.00 mg/dL   Total Protein, Urine 20 mg/dL   Protein Creatinine Ratio 0.07 0.00 - 0.15 mg/mg[Cre]  CBC     Status: Abnormal   Collection Time: 04/03/18 11:23 AM  Result Value Ref Range   WBC 7.2 4.0 - 10.5 K/uL   RBC 3.59 (L) 3.87 - 5.11 MIL/uL   Hemoglobin 10.6 (L) 12.0 - 15.0 g/dL   HCT 16.1 (L) 09.6 - 04.5 %   MCV 87.5 78.0 - 100.0 fL   MCH 29.5 26.0 - 34.0 pg   MCHC 33.8 30.0 - 36.0 g/dL   RDW 40.9 81.1 - 91.4 %   Platelets 215 150 - 400 K/uL  Comprehensive metabolic panel     Status: Abnormal   Collection Time: 04/03/18 11:23 AM  Result Value Ref Range   Sodium 135 135 - 145 mmol/L   Potassium 4.0 3.5 - 5.1 mmol/L   Chloride 105 101 - 111 mmol/L   CO2 21 (L) 22 - 32 mmol/L   Glucose, Bld 75 65 - 99 mg/dL   BUN 12 6 - 20 mg/dL   Creatinine, Ser 7.82 0.44 - 1.00 mg/dL   Calcium 9.0 8.9 - 95.6 mg/dL   Total Protein 7.4 6.5 - 8.1 g/dL   Albumin 3.0 (L) 3.5 - 5.0 g/dL   AST 23 15 - 41 U/L   ALT 18 14 - 54 U/L   Alkaline Phosphatase 270 (H) 38 - 126 U/L   Total Bilirubin 0.7 0.3 - 1.2 mg/dL   GFR calc non Af Amer >60 >60 mL/min   GFR calc Af Amer >60 >60 mL/min   Anion gap 9 5 - 15   MAU Course  Procedures Tylenol  MDM Labs ordered and reviewed. HA improved. No evidence of pre-e.  Mildly elevated BPs and doesn't meet criteria for GHTN (BPs less than 4 hrs). Will rpt BP tomorrow in office. Stable for discharge home.   Assessment and Plan   1. [redacted] weeks gestation of pregnancy   2. Supervision  of other normal pregnancy, antepartum   3. NST (non-stress test)  reactive   4. Hypertension affecting pregnancy in third trimester    [facility]- message sent to admin pool Pre-e precautions Rest  Hydrate  Allergies as of 04/03/2018   No Known Allergies     Medication List    STOP taking these medications   Doxylamine-Pyridoxine 10-10 MG Tbec Commonly known as:  DICLEGIS   fluticasone 50 MCG/ACT nasal spray Commonly known as:  FLONASE     TAKE these medications   COMFORT FIT MATERNITY SUPP LG Misc 1 Units by Does not apply route daily.   PRENATE PIXIE 10-0.6-0.4-200 MG Caps Take 1 tablet by mouth daily.      Donette LarryMelanie Kaleesi Guyton, CNM 04/03/2018, 12:12 PM

## 2018-04-03 NOTE — Discharge Instructions (Signed)

## 2018-04-03 NOTE — MAU Note (Signed)
Pt sent from office for HTN. Pt says she has a headache that started last night 5/10, hasn't taken anything for it.

## 2018-04-03 NOTE — Progress Notes (Signed)
To MAU for evaluation of elevated blood pressures in the office.  Not seen by provider.  MAU notified.  R.Kalup Jaquith CNM

## 2018-04-04 ENCOUNTER — Ambulatory Visit: Payer: Medicaid Other

## 2018-04-04 ENCOUNTER — Encounter (HOSPITAL_COMMUNITY): Payer: Self-pay

## 2018-04-04 ENCOUNTER — Other Ambulatory Visit: Payer: Self-pay | Admitting: Certified Nurse Midwife

## 2018-04-04 ENCOUNTER — Inpatient Hospital Stay (HOSPITAL_COMMUNITY): Payer: Medicaid Other | Admitting: Anesthesiology

## 2018-04-04 ENCOUNTER — Inpatient Hospital Stay (HOSPITAL_COMMUNITY)
Admission: AD | Admit: 2018-04-04 | Discharge: 2018-04-06 | DRG: 807 | Disposition: A | Discharge status: Home / Self Care | Payer: Medicaid Other | Attending: Obstetrics and Gynecology | Admitting: Obstetrics and Gynecology

## 2018-04-04 VITALS — BP 136/91

## 2018-04-04 DIAGNOSIS — O133 Gestational [pregnancy-induced] hypertension without significant proteinuria, third trimester: Secondary | ICD-10-CM

## 2018-04-04 DIAGNOSIS — O1400 Mild to moderate pre-eclampsia, unspecified trimester: Secondary | ICD-10-CM | POA: Diagnosis present

## 2018-04-04 DIAGNOSIS — O163 Unspecified maternal hypertension, third trimester: Secondary | ICD-10-CM

## 2018-04-04 DIAGNOSIS — O9902 Anemia complicating childbirth: Secondary | ICD-10-CM | POA: Diagnosis present

## 2018-04-04 DIAGNOSIS — Z3A38 38 weeks gestation of pregnancy: Secondary | ICD-10-CM

## 2018-04-04 DIAGNOSIS — O1404 Mild to moderate pre-eclampsia, complicating childbirth: Principal | ICD-10-CM | POA: Diagnosis present

## 2018-04-04 DIAGNOSIS — D649 Anemia, unspecified: Secondary | ICD-10-CM | POA: Diagnosis present

## 2018-04-04 DIAGNOSIS — O134 Gestational [pregnancy-induced] hypertension without significant proteinuria, complicating childbirth: Secondary | ICD-10-CM | POA: Diagnosis present

## 2018-04-04 DIAGNOSIS — Z3490 Encounter for supervision of normal pregnancy, unspecified, unspecified trimester: Secondary | ICD-10-CM

## 2018-04-04 HISTORY — DX: Mild to moderate pre-eclampsia, unspecified trimester: O14.00

## 2018-04-04 LAB — CBC
HEMATOCRIT: 33.4 % — AB (ref 36.0–46.0)
HEMATOCRIT: 35.4 % — AB (ref 36.0–46.0)
HEMOGLOBIN: 11.9 g/dL — AB (ref 12.0–15.0)
Hemoglobin: 11.1 g/dL — ABNORMAL LOW (ref 12.0–15.0)
MCH: 29.8 pg (ref 26.0–34.0)
MCH: 29.5 pg (ref 26.0–34.0)
MCHC: 33.2 g/dL (ref 30.0–36.0)
MCHC: 33.6 g/dL (ref 30.0–36.0)
MCV: 89.8 fL (ref 78.0–100.0)
MCV: 87.8 fL (ref 78.0–100.0)
PLATELETS: 230 10*3/uL (ref 150–400)
Platelets: 232 10*3/uL (ref 150–400)
RBC: 3.72 MIL/uL — ABNORMAL LOW (ref 3.87–5.11)
RBC: 4.03 MIL/uL (ref 3.87–5.11)
RDW: 13.1 % (ref 11.5–15.5)
RDW: 13.1 % (ref 11.5–15.5)
WBC: 7.5 10*3/uL (ref 4.0–10.5)
WBC: 14.8 10*3/uL — ABNORMAL HIGH (ref 4.0–10.5)

## 2018-04-04 LAB — COMPREHENSIVE METABOLIC PANEL
ALT: 19 U/L (ref 14–54)
ANION GAP: 9 (ref 5–15)
AST: 27 U/L (ref 15–41)
Albumin: 3.2 g/dL — ABNORMAL LOW (ref 3.5–5.0)
Alkaline Phosphatase: 321 U/L — ABNORMAL HIGH (ref 38–126)
BILIRUBIN TOTAL: 0.8 mg/dL (ref 0.3–1.2)
BUN: 11 mg/dL (ref 6–20)
CO2: 20 mmol/L — ABNORMAL LOW (ref 22–32)
Calcium: 9.5 mg/dL (ref 8.9–10.3)
Chloride: 105 mmol/L (ref 101–111)
Creatinine, Ser: 0.65 mg/dL (ref 0.44–1.00)
GFR calc Af Amer: >60 mL/min (ref >60)
Glucose, Bld: 64 mg/dL — ABNORMAL LOW (ref 65–99)
POTASSIUM: 4.3 mmol/L (ref 3.5–5.1)
Sodium: 134 mmol/L — ABNORMAL LOW (ref 135–145)
TOTAL PROTEIN: 7.2 g/dL (ref 6.5–8.1)

## 2018-04-04 LAB — TYPE AND SCREEN
ABO/RH(D): A POS
Antibody Screen: NEGATIVE

## 2018-04-04 MED ORDER — LIDOCAINE HCL (PF) 1 % IJ SOLN
INTRAMUSCULAR | Status: DC | PRN
  Administered 2018-04-04 (×2): 4 mL via EPIDURAL

## 2018-04-04 MED ORDER — OXYTOCIN 40 UNITS IN LACTATED RINGERS INFUSION - SIMPLE MED
2.5000 [IU]/h | INTRAVENOUS | Status: DC
  Filled 2018-04-04: qty 1000

## 2018-04-04 MED ORDER — FENTANYL 2.5 MCG/ML BUPIVACAINE 1/10 % EPIDURAL INFUSION (WH - ANES)
14.0000 mL/h | INTRAMUSCULAR | Status: DC | PRN
  Administered 2018-04-04: 14 mL/h via EPIDURAL
  Filled 2018-04-04: qty 100

## 2018-04-04 MED ORDER — OXYCODONE HCL 5 MG PO TABS: 5.0000 mg | ORAL_TABLET | ORAL | Status: DC | PRN

## 2018-04-04 MED ORDER — MISOPROSTOL 25 MCG QUARTER TABLET
25.0000 ug | ORAL_TABLET | ORAL | Status: DC | PRN
  Filled 2018-04-04: qty 1

## 2018-04-04 MED ORDER — TETANUS-DIPHTH-ACELL PERTUSSIS 5-2.5-18.5 LF-MCG/0.5 IM SUSP: 0.5000 mL | Freq: Once | INTRAMUSCULAR | Status: DC

## 2018-04-04 MED ORDER — COCONUT OIL OIL: 1.0000 "application " | TOPICAL_OIL | Status: DC | PRN

## 2018-04-04 MED ORDER — MISOPROSTOL 50MCG HALF TABLET: 50.0000 ug | ORAL_TABLET | ORAL | Status: DC

## 2018-04-04 MED ORDER — FENTANYL CITRATE (PF) 100 MCG/2ML IJ SOLN
100.0000 ug | INTRAMUSCULAR | Status: DC | PRN
Start: 2018-04-04 — End: 2018-04-04

## 2018-04-04 MED ORDER — LACTATED RINGERS IV SOLN
500.0000 mL | Freq: Once | INTRAVENOUS | Status: AC
  Administered 2018-04-04: 500 mL via INTRAVENOUS

## 2018-04-04 MED ORDER — OXYTOCIN 40 UNITS IN LACTATED RINGERS INFUSION - SIMPLE MED
1.0000 m[IU]/min | INTRAVENOUS | Status: DC
  Administered 2018-04-04: 2 m[IU]/min via INTRAVENOUS

## 2018-04-04 MED ORDER — WITCH HAZEL-GLYCERIN EX PADS: 1.0000 "application " | MEDICATED_PAD | CUTANEOUS | Status: DC | PRN

## 2018-04-04 MED ORDER — LABETALOL HCL 5 MG/ML IV SOLN
20.0000 mg | INTRAVENOUS | Status: DC | PRN
  Administered 2018-04-04: 20 mg via INTRAVENOUS
  Filled 2018-04-04: qty 8

## 2018-04-04 MED ORDER — OXYTOCIN BOLUS FROM INFUSION
500.0000 mL | Freq: Once | INTRAVENOUS | Status: AC
  Administered 2018-04-04: 500 mL via INTRAVENOUS

## 2018-04-04 MED ORDER — PRENATAL MULTIVITAMIN CH
1.0000 | ORAL_TABLET | Freq: Every day | ORAL | Status: DC
  Administered 2018-04-05 – 2018-04-06 (×2): 1 via ORAL
  Filled 2018-04-04 (×2): qty 1

## 2018-04-04 MED ORDER — DIBUCAINE 1 % RE OINT: 1.0000 "application " | TOPICAL_OINTMENT | RECTAL | Status: DC | PRN

## 2018-04-04 MED ORDER — EPHEDRINE 5 MG/ML INJ
10.0000 mg | INTRAVENOUS | Status: DC | PRN
  Filled 2018-04-04: qty 2

## 2018-04-04 MED ORDER — ONDANSETRON HCL 4 MG/2ML IJ SOLN: 4.0000 mg | Freq: Four times a day (QID) | INTRAMUSCULAR | Status: DC | PRN

## 2018-04-04 MED ORDER — BENZOCAINE-MENTHOL 20-0.5 % EX AERO: 1.0000 "application " | INHALATION_SPRAY | CUTANEOUS | Status: DC | PRN

## 2018-04-04 MED ORDER — LABETALOL HCL 5 MG/ML IV SOLN
INTRAVENOUS | Status: AC
  Filled 2018-04-04: qty 4

## 2018-04-04 MED ORDER — NIFEDIPINE ER OSMOTIC RELEASE 30 MG PO TB24
30.0000 mg | ORAL_TABLET | Freq: Every day | ORAL | Status: DC
  Administered 2018-04-05: 30 mg via ORAL
  Filled 2018-04-04 (×2): qty 1

## 2018-04-04 MED ORDER — HYDRALAZINE HCL 20 MG/ML IJ SOLN: 10.0000 mg | Freq: Once | INTRAMUSCULAR | Status: DC | PRN

## 2018-04-04 MED ORDER — LACTATED RINGERS IV SOLN: 500.0000 mL | INTRAVENOUS | Status: DC | PRN

## 2018-04-04 MED ORDER — OXYCODONE-ACETAMINOPHEN 5-325 MG PO TABS: 1.0000 | ORAL_TABLET | ORAL | Status: DC | PRN

## 2018-04-04 MED ORDER — MISOPROSTOL 200 MCG PO TABS
800.0000 ug | ORAL_TABLET | Freq: Once | ORAL | Status: AC
  Administered 2018-04-04: 800 ug via ORAL

## 2018-04-04 MED ORDER — MISOPROSTOL 50MCG HALF TABLET
50.0000 ug | ORAL_TABLET | ORAL | Status: DC
  Filled 2018-04-04 (×6): qty 1

## 2018-04-04 MED ORDER — IBUPROFEN 600 MG PO TABS
600.0000 mg | ORAL_TABLET | Freq: Four times a day (QID) | ORAL | Status: DC
  Administered 2018-04-05 – 2018-04-06 (×7): 600 mg via ORAL
  Filled 2018-04-04 (×7): qty 1

## 2018-04-04 MED ORDER — LIDOCAINE HCL (PF) 1 % IJ SOLN
30.0000 mL | INTRAMUSCULAR | Status: DC | PRN
  Filled 2018-04-04: qty 30

## 2018-04-04 MED ORDER — TERBUTALINE SULFATE 1 MG/ML IJ SOLN
0.2500 mg | Freq: Once | INTRAMUSCULAR | Status: DC | PRN
Start: 2018-04-04 — End: 2018-04-04
  Filled 2018-04-04: qty 1

## 2018-04-04 MED ORDER — DIPHENHYDRAMINE HCL 50 MG/ML IJ SOLN: 12.5000 mg | INTRAMUSCULAR | Status: DC | PRN

## 2018-04-04 MED ORDER — SIMETHICONE 80 MG PO CHEW
80.0000 mg | CHEWABLE_TABLET | ORAL | Status: DC | PRN
Start: 2018-04-04 — End: 2018-04-06

## 2018-04-04 MED ORDER — LACTATED RINGERS IV SOLN
INTRAVENOUS | Status: DC
  Administered 2018-04-04: 16:00:00 via INTRAVENOUS

## 2018-04-04 MED ORDER — ZOLPIDEM TARTRATE 5 MG PO TABS: 5.0000 mg | ORAL_TABLET | Freq: Every evening | ORAL | Status: DC | PRN

## 2018-04-04 MED ORDER — TERBUTALINE SULFATE 1 MG/ML IJ SOLN
0.2500 mg | Freq: Once | INTRAMUSCULAR | Status: DC | PRN
  Filled 2018-04-04: qty 1

## 2018-04-04 MED ORDER — ONDANSETRON HCL 4 MG PO TABS: 4.0000 mg | ORAL_TABLET | ORAL | Status: DC | PRN

## 2018-04-04 MED ORDER — OXYCODONE-ACETAMINOPHEN 5-325 MG PO TABS: 2.0000 | ORAL_TABLET | ORAL | Status: DC | PRN

## 2018-04-04 MED ORDER — ONDANSETRON HCL 4 MG/2ML IJ SOLN: 4.0000 mg | INTRAMUSCULAR | Status: DC | PRN

## 2018-04-04 MED ORDER — ACETAMINOPHEN 325 MG PO TABS: 650.0000 mg | ORAL_TABLET | ORAL | Status: DC | PRN

## 2018-04-04 MED ORDER — SOD CITRATE-CITRIC ACID 500-334 MG/5ML PO SOLN: 30.0000 mL | ORAL | Status: DC | PRN

## 2018-04-04 MED ORDER — PHENYLEPHRINE 40 MCG/ML (10ML) SYRINGE FOR IV PUSH (FOR BLOOD PRESSURE SUPPORT)
80.0000 ug | PREFILLED_SYRINGE | INTRAVENOUS | Status: DC | PRN
  Filled 2018-04-04: qty 5

## 2018-04-04 MED ORDER — MISOPROSTOL 200 MCG PO TABS
ORAL_TABLET | ORAL | Status: AC
  Filled 2018-04-04: qty 4

## 2018-04-04 MED ORDER — SENNOSIDES-DOCUSATE SODIUM 8.6-50 MG PO TABS
2.0000 | ORAL_TABLET | ORAL | Status: DC
  Administered 2018-04-05 (×2): 2 via ORAL
  Filled 2018-04-04 (×2): qty 2

## 2018-04-04 MED ORDER — OXYTOCIN 10 UNIT/ML IJ SOLN: 10.0000 [IU] | Freq: Once | INTRAMUSCULAR | Status: DC

## 2018-04-04 MED ORDER — PHENYLEPHRINE 40 MCG/ML (10ML) SYRINGE FOR IV PUSH (FOR BLOOD PRESSURE SUPPORT)
80.0000 ug | PREFILLED_SYRINGE | INTRAVENOUS | Status: DC | PRN
Start: 2018-04-04 — End: 2018-04-04
  Filled 2018-04-04: qty 5
  Filled 2018-04-04: qty 10

## 2018-04-04 MED ORDER — FENTANYL 2.5 MCG/ML BUPIVACAINE 1/10 % EPIDURAL INFUSION (WH - ANES): 14.0000 mL/h | INTRAMUSCULAR | Status: DC | PRN

## 2018-04-04 MED ORDER — DIPHENHYDRAMINE HCL 25 MG PO CAPS: 25.0000 mg | ORAL_CAPSULE | Freq: Four times a day (QID) | ORAL | Status: DC | PRN

## 2018-04-04 NOTE — Progress Notes (Signed)
Subjective:  Shelley [name] is a 27 y.o. female here for BP check.   Hypertension ROS: patient does not perform home BP monitoring, no TIA's, possible neurological symptoms pt has been experiencing headaches since yesterday. , no chest pain on exertion, no dyspnea on exertion and no swelling of ankles.    Objective:  BP (!) 136/91   LMP 07/11/2017   Appearance alert, well appearing, and in no distress. General exam BP noted to be well controlled today in office.    Assessment:   Blood Pressure needs further observation.   Plan:  Consulted with provider, pt to MAU for IOL.

## 2018-04-04 NOTE — Anesthesia Procedure Notes (Signed)
Epidural Patient location during procedure: OB  Staffing Anesthesiologist: Aison Malveaux, MD Performed: anesthesiologist   Preanesthetic Checklist Completed: patient identified, pre-op evaluation, timeout performed, IV checked, risks and benefits discussed and monitors and equipment checked  Epidural Patient position: sitting Prep: site prepped and draped and DuraPrep Patient monitoring: heart rate, continuous pulse ox and blood pressure Approach: midline Location: L3-L4 Injection technique: LOR air and LOR saline  Needle:  Needle type: Tuohy  Needle gauge: 17 G Needle length: 9 cm Needle insertion depth: 5 cm Catheter type: closed end flexible Catheter size: 19 Gauge Catheter at skin depth: 10 cm Test dose: negative  Assessment Sensory level: T8 Events: blood not aspirated, injection not painful, no injection resistance, negative IV test and no paresthesia  Additional Notes Reason for block:procedure for pain     

## 2018-04-04 NOTE — Progress Notes (Signed)
Labor Progress Note Elouise MunroeFateemah Minella is a 27 y.o. G2P1001 at 478w6d presented for IOL for GHTN  S:  Comfortable, no c/o.   O:  BP 128/89 (BP Location: Right Arm)   Pulse 83   Temp 98.7 F (37.1 C) (Oral)   Resp 18   Ht 5\' 4"  (1.626 m)   Wt 149 lb 14.4 oz (68 kg)   LMP 07/11/2017   SpO2 100%   BMI 25.73 kg/m  EFM: baseline 130 bpm/ mod variability/ + accels/ no decels  Toco: irregular, mild SVE:  1/40/-2, vtx   A/P: 27 y.o. G2P1001 7578w6d  1. Labor: latent 2. FWB: Cat I 3. Pain: analgesia prn  Recommend FB and Cytotec for ripening, pt agrees with plan. FB placed, inflated with 60 mL, tolerated well. Will start buccal Cytotec. Awaiting LD bed.  Donette LarryMelanie Tremane Spurgeon, CNM 2:34 PM

## 2018-04-04 NOTE — MAU Note (Signed)
Here for induction, slight headache, no other complaints. +FM

## 2018-04-04 NOTE — H&P (Signed)
OBSTETRIC ADMISSION HISTORY AND PHYSICAL  Elouise MunroeFateemah Yono is a 27 y.o. female G2P1001 with IUP at 3330w6d presenting for IOL for Gestational HTN. She reports +FMs. No LOF, VB, blurry vision, headaches, peripheral edema, or RUQ pain. She plans on breastfeeding. She requests Depo for birth control.  Dating: By LMP --->  Estimated Date of Delivery: 04/12/18  Sono:    @[redacted]w[redacted]d , CWD, normal anatomy, 253g   Prenatal History/Complications: -Gestational HTN -anemia  Past Medical History: Past Medical History:  Diagnosis Date  . Medical history non-contributory     Past Surgical History: Past Surgical History:  Procedure Laterality Date  . WISDOM TOOTH EXTRACTION      Obstetrical History: OB History    Gravida  2   Para  1   Term  1   Preterm  0   AB  0   Living  1     SAB  0   TAB  0   Ectopic  0   Multiple  0   Live Births  1           Social History: Social History   Socioeconomic History  . Marital status: Single    Spouse name: Not on file  . Number of children: 1  . Years of education: 3411  . Highest education level: Not on file  Occupational History  . Occupation: Publishing [employer]    Employer: UNEMPLOYED  Social Needs  . Financial resource strain: Not on file  . Food insecurity:    Worry: Not on file    Inability: Not on file  . Transportation needs:    Medical: Not on file    Non-medical: Not on file  Tobacco Use  . Smoking status: Never Smoker  . Smokeless tobacco: Never Used  Substance and Sexual Activity  . Alcohol use: No  . Drug use: No    Types: Marijuana    Comment: not since pregnancy  . Sexual activity: Yes  Lifestyle  . Physical activity:    Days per week: Not on file    Minutes per session: Not on file  . Stress: Not on file  Relationships  . Social connections:    Talks on phone: Not on file    Gets together: Not on file    Attends religious service: Not on file    Active member of club or organization: Not on file     Attends meetings of clubs or organizations: Not on file    Relationship status: Not on file  Other Topics Concern  . Not on file  Social History Narrative  . Not on file    Family History: Family History  Problem Relation Age of Onset  . Diabetes Maternal Grandmother   . Hypertension Maternal Grandmother   . Asthma Maternal Grandmother     Allergies: No Known Allergies  Medications Prior to Admission  Medication Sig Dispense Refill Last Dose  . acetaminophen (TYLENOL) 500 MG tablet Take 500 mg by mouth every 6 (six) hours as needed for mild pain or headache.   04/03/2018 at Unknown time  . oxymetazoline (AFRIN) 0.05 % nasal spray Place 1 spray into both nostrils 2 (two) times daily as needed for congestion.   04/04/2018 at Unknown time  . Prenat-FeAsp-Meth-FA-DHA w/o A (PRENATE PIXIE) 10-0.6-0.4-200 MG CAPS Take 1 tablet by mouth daily. 30 capsule 12 04/03/2018 at Unknown time  . Elastic Bandages & Supports (COMFORT FIT MATERNITY SUPP LG) MISC 1 Units by Does not apply route daily. 1 each  0 Taking     Review of Systems   All systems reviewed and negative except as stated in HPI  Blood pressure 132/86, pulse 84, temperature 98.8 F (37.1 C), temperature source Oral, resp. rate 18, height 5\' 4"  (1.626 m), weight 149 lb 14.4 oz (68 kg), last menstrual period 07/11/2017. General appearance: alert, cooperative and no distress Lungs: regular rate and effort Heart: regular rate  Abdomen: soft, non-tender Extremities: Homans sign is negative, no sign of DVT Presentation: cephalic Fetal monitoringBaseline: 135 bpm, Variability: Good {> 6 bpm), Accelerations: Reactive and Decelerations: Absent Uterine activity irritability    Prenatal labs: ABO, Rh: A/Positive/-- (12/20 1253) Antibody: Negative (12/20 1253) Rubella: 12.90 (12/20 1253) RPR: Non Reactive (04/04 1132)  HBsAg: Negative (12/20 1253)  HIV: Non Reactive (04/04 1132)  GBS: Negative (05/30 1112)  2 hr GTT  nml  Prenatal Transfer Tool  Maternal Diabetes: No Genetic Screening: Normal Maternal Ultrasounds/Referrals: Normal Fetal Ultrasounds or other Referrals:  None Maternal Substance Abuse:  No Significant Maternal Medications:  None Significant Maternal Lab Results: None  No results found for this or any previous visit (from the past 24 hour(s)).  Patient Active Problem List   Diagnosis Date Noted  . Anemia during pregnancy in third trimester 01/21/2018  . 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 Vitamin D deficiency 10/04/2017  . Supervision of normal pregnancy, antepartum 09/24/2017    Assessment: Oni Dietzman is a 27 y.o. G2P1001 at [redacted]w[redacted]d here for IOL for Gestational HTN, diagnosed today  1. Labor: latent 2. FWB: Cat I 3. Pain: analgesia prn 4. GBS: neg   Plan: Admit to BS Cervical ripening with FB and Cytotec Monitor BPs Anticipate SVD  Donette Larry, CNM  04/04/2018, 12:50 PM

## 2018-04-04 NOTE — Progress Notes (Signed)
Seen by CMA in office patient presents with mildly elevated blood pressures, normal labs at MAU on 04/03/18, has headache that has not gone away.  Orders placed for direct admission.  Report called to Philipp DeputyKim Shaw, CNM.  R.Chelse Matas CNM

## 2018-04-04 NOTE — Progress Notes (Signed)
Patient ID: Shelley MunroeFateemah [name], female   DOB: 1991-02-27, 27 y.o.   MRN: [id number]  Cervical foley out soon after pt arrived to L&D; Pit started recently  BP 132/90, other VSS FHR 130s, +accels, no decels Irreg ctx; Pit at 234mu/min Cx 4-5 cm; AROM for clear fluid  IUP@term  Latent labor gHTN  Anticipate with ROM that active labor will start soon Watch BPs  Cam HaiSHAW, Romy Ipock CNM 04/04/2018

## 2018-04-04 NOTE — Anesthesia Pain Management Evaluation Note (Signed)
  CRNA Pain Management Visit Note  Patient: Shelley [name], 27 y.o., female  "Hello I am a member of the anesthesia team at [facility]. We have an anesthesia team available at all times to provide care throughout the hospital, including epidural management and anesthesia for C-section. I don't know your plan for the delivery whether it a natural birth, water birth, IV sedation, nitrous supplementation, doula or epidural, but we want to meet your pain goals."   1.Was your pain managed to your expectations on prior hospitalizations?   Unable to assess - patient sleeping  2.What is your expectation for pain management during this hospitalization?     Epidural  3.How can we help you reach that goal? Epidural in place  Record the patient's initial score and the patient's pain goal.   Pain: Patient sleeping - unable to assess  Pain Goal: Patient sleeping - unable to assess [facility] wants you to be able to say your pain was always managed very well.  Uziel Covault 04/04/2018

## 2018-04-04 NOTE — Anesthesia Preprocedure Evaluation (Signed)
Anesthesia Evaluation  Patient identified by MRN, date of birth, ID band Patient awake    Reviewed: Allergy & Precautions, Patient's Chart, lab work & pertinent test results  Airway Mallampati: II  TM Distance: >3 FB Neck ROM: Full    Dental no notable dental hx.    Pulmonary neg pulmonary ROS,    Pulmonary exam normal breath sounds clear to auscultation       Cardiovascular hypertension, Normal cardiovascular exam Rhythm:Regular Rate:Normal     Neuro/Psych negative neurological ROS  negative psych ROS   GI/Hepatic negative GI ROS, Neg liver ROS,   Endo/Other  negative endocrine ROS  Renal/GU negative Renal ROS     Musculoskeletal negative musculoskeletal ROS (+)   Abdominal   Peds  Hematology negative hematology ROS (+) anemia ,   Anesthesia Other Findings   Reproductive/Obstetrics (+) Pregnancy                             Anesthesia Physical Anesthesia Plan  ASA: II  Anesthesia Plan: Epidural   Post-op Pain Management:    Induction:   PONV Risk Score and Plan:   Airway Management Planned:   Additional Equipment:   Intra-op Plan:   Post-operative Plan:   Informed Consent: I have reviewed the patients History and Physical, chart, labs and discussed the procedure including the risks, benefits and alternatives for the proposed anesthesia with the patient or authorized representative who has indicated his/her understanding and acceptance.     Plan Discussed with:   Anesthesia Plan Comments:         Anesthesia Quick Evaluation

## 2018-04-05 LAB — RPR: RPR Ser Ql: NONREACTIVE

## 2018-04-05 MED ORDER — NIFEDIPINE ER OSMOTIC RELEASE 30 MG PO TB24
30.0000 mg | ORAL_TABLET | Freq: Every day | ORAL | Status: DC
  Administered 2018-04-05 – 2018-04-06 (×2): 30 mg via ORAL
  Filled 2018-04-05 (×2): qty 1

## 2018-04-05 NOTE — Progress Notes (Signed)
Post Partum Day #1 Subjective: no complaints, up ad lib and tolerating PO; started on Procardia 30 XL last night for borderline pressures; breastfeeding going well; Depo for contraception  Objective: Blood pressure 131/90, pulse 79, temperature 98.1 F (36.7 C), temperature source Oral, resp. rate 18, height 5\' 4"  (1.626 m), weight 68 kg (149 lb 14.4 oz), last menstrual period 07/11/2017, SpO2 100 %, unknown if currently breastfeeding.  Physical Exam:  General: alert, cooperative and no distress Lochia: appropriate Uterine Fundus: firm DVT Evaluation: No evidence of DVT seen on physical exam.  Recent Labs    04/04/18 1515 04/04/18 2155  HGB 11.1* 11.9*  HCT 33.4* 35.4*    Assessment/Plan: Plan for discharge tomorrow  Continue on Procardia; will have BP check in 1 week    LOS: 1 day   Nailyn Dearinger CNM 04/05/2018, 9:14 AM

## 2018-04-05 NOTE — Anesthesia Postprocedure Evaluation (Signed)
Anesthesia Post Note  Patient: Shelley [name]  Procedure(s) Performed: AN AD HOC LABOR EPIDURAL     Patient location during evaluation: Mother Baby Anesthesia Type: Epidural Level of consciousness: awake and alert Pain management: pain level controlled Vital Signs Assessment: post-procedure vital signs reviewed and stable Respiratory status: spontaneous breathing, nonlabored ventilation and respiratory function stable Cardiovascular status: stable Postop Assessment: no headache, no backache, epidural receding, no apparent nausea or vomiting, patient able to bend at knees, able to ambulate and adequate PO intake Anesthetic complications: no    Last Vitals:  Vitals:   04/05/18 0249 04/05/18 0607  BP: 128/84 (!) 138/96  Pulse: 95 84  Resp: 18 18  Temp: 37.1 C 36.8 C  SpO2: 100% 100%    Last Pain:  Vitals:   04/05/18 0607  TempSrc: Oral  PainSc: 4    Pain Goal: Patients Stated Pain Goal: 3 (04/05/18 0607)               Laban EmperorMalinova,Hadleigh Felber Hristova

## 2018-04-05 NOTE — Progress Notes (Addendum)
Notified CNM Sylvie FarrierCatherine Kooistira of BP 129/86 and reviewed other AM BPs. Pt received procardia 30mg  XL around midnight. Care order received to hold current dose procardia 30mg  XL and reschedule doses for 6/23@1600  and 6/24@1000 .

## 2018-04-05 NOTE — Lactation Note (Signed)
This note was copied from a baby's chart. Lactation Consultation Note  Patient Name: Shelley Elouise MunroeFateemah Mcwilliams [id number]'s Date: 04/05/2018 Reason for consult: Initial assessment;Term;Infant < 6lbs  P2 mother whose infant is now 858 hours old.  Mother breastfed her first child who is now 25 years old.  Mother feeding for approximately 20 minutes when I arrived.  Baby is latched in the cradle hold on the right breast.  Baby has wide flanged lips and mother states no pain.  Reminded mother to keep tight into breast tissue when feeding to maintain a deep latch.    Encouraged feeding 8-12 times/24 hours or more if baby shows feeding cues.Reviewed feeding cues.  Mother has baby swaddled and I suggested skin to skin during all feeds.  Mother unwrapped baby and continued STS.  Reviewed hand expression and breast compressions. Mom made aware of O/P services, breastfeeding support groups, community resources, and our phone # for post-discharge questions.  Mother has no questions/concerns at this time.  She will call for latch assistance as needed.  Father present and sleeping on couch.   Maternal Data Formula Feeding for Exclusion: No Has patient been taught Hand Expression?: Yes Does the patient have breastfeeding experience prior to this delivery?: Yes  Feeding Feeding Type: Breast Fed Length of feed: 30 min  LATCH Score Latch: Grasps breast easily, tongue down, lips flanged, rhythmical sucking.  Audible Swallowing: A few with stimulation  Type of Nipple: Everted at rest and after stimulation  Comfort (Breast/Nipple): Soft / non-tender  Hold (Positioning): Assistance needed to correctly position infant at breast and maintain latch.  LATCH Score: 8  Interventions Interventions: Breast feeding basics reviewed;Skin to skin;Breast massage;Breast compression  Lactation Tools Discussed/Used     Consult Status Consult Status: Follow-up Date: 04/06/18 Follow-up type: In-patient    Dayson Aboud R  Khup Sapia 04/05/2018, 5:42 AM

## 2018-04-06 MED ORDER — NIFEDIPINE ER 30 MG PO TB24: 30.0000 mg | ORAL_TABLET | Freq: Every day | ORAL | 11 refills | Status: DC

## 2018-04-06 NOTE — Discharge Summary (Signed)
OB Discharge Summary     Patient Name: Shelley [name] DOB: 05/03/1991 MRN: [id number]  Date of admission: 04/04/2018 Delivering MD: Cam Hai D   Date of discharge: 04/06/2018  Admitting diagnosis: INDUCTION Intrauterine pregnancy: [redacted]w[redacted]d     Secondary diagnosis:  Active Problems:   Supervision of normal pregnancy, antepartum   Mild to moderate pre-eclampsia, antepartum   SVD (spontaneous vaginal delivery)  Additional problems: Gestational hypertension     Discharge diagnosis: Term Pregnancy Delivered, Gestational Hypertension and Preeclampsia (mild)                                                                                                Post partum procedures:none  Augmentation: AROM, Pitocin and Foley Balloon  Complications: None  Hospital course:  Induction of Labor With Vaginal Delivery   27 y.o. yo [id number] at [redacted]w[redacted]d was admitted to the hospital 04/04/2018 for induction of labor.  Indication for induction: Gestational hypertension.  Patient had an uncomplicated labor course as follows: Membrane Rupture Time/Date: 5:19 PM ,04/04/2018   Intrapartum Procedures: Episiotomy: None [1]                                         Lacerations:  None [1]  Patient had delivery of a Viable infant.  Information for the patient's newborn:  Zariel, Capano [[id number]]  Delivery Method: Vaginal, Spontaneous(Filed from Delivery Summary)   04/04/2018  Details of delivery can be found in separate delivery note.  Patient had a routine postpartum course. Patient is discharged home 04/06/18.  Physical exam  Vitals:   04/05/18 1300 04/05/18 1605 04/05/18 2100 04/06/18 0620  BP: 125/83 131/85 112/82 125/87  Pulse: 96 89 83 84  Resp: 17 18 18 18   Temp: 98.1 F (36.7 C) 97.6 F (36.4 C) 98.4 F (36.9 C) 98.2 F (36.8 C)  TempSrc: Oral Oral Oral Oral  SpO2:    99%  Weight:      Height:       General: alert, cooperative and no distress Lochia: appropriate Uterine Fundus:  firm Incision: N/A DVT Evaluation: No evidence of DVT seen on physical exam. No cords or calf tenderness. No significant calf/ankle edema. Labs: Lab Results  Component Value Date   WBC 14.8 (H) 04/04/2018   HGB 11.9 (L) 04/04/2018   HCT 35.4 (L) 04/04/2018   MCV 87.8 04/04/2018   PLT 232 04/04/2018   CMP Latest Ref Rng & Units 04/04/2018  Glucose 65 - 99 mg/dL 13(Y)  BUN 6 - 20 mg/dL 11  Creatinine 8.65 - 7.84 mg/dL 6.96  Sodium 295 - 284 mmol/L 134(L)  Potassium 3.5 - 5.1 mmol/L 4.3  Chloride 101 - 111 mmol/L 105  CO2 22 - 32 mmol/L 20(L)  Calcium 8.9 - 10.3 mg/dL 9.5  Total Protein 6.5 - 8.1 g/dL 7.2  Total Bilirubin 0.3 - 1.2 mg/dL 0.8  Alkaline Phos 38 - 126 U/L 321(H)  AST 15 - 41 U/L 27  ALT 14 - 54 U/L 19  Discharge instruction: per After Visit Summary and "Baby and Me Booklet".  After visit meds:  Allergies as of 04/06/2018   No Known Allergies     Medication List    TAKE these medications   acetaminophen 500 MG tablet Commonly known as:  TYLENOL Take 500 mg by mouth every 6 (six) hours as needed for mild pain or headache.   COMFORT FIT MATERNITY SUPP LG Misc 1 Units by Does not apply route daily.   oxymetazoline 0.05 % nasal spray Commonly known as:  AFRIN Place 1 spray into both nostrils 2 (two) times daily as needed for congestion.   PRENATE PIXIE 10-0.6-0.4-200 MG Caps Take 1 tablet by mouth daily.       Diet: routine diet  Activity: Advance as tolerated. Pelvic rest for 6 weeks.   Outpatient follow up:4 weeks Follow up Appt:No future appointments. Follow up Visit:No follow-ups on file.  Postpartum contraception: Depo Provera  Newborn Data: Live born female  Birth Weight: 5 lb 6.2 oz (2444 g) APGAR: 9, 9  Newborn Delivery   Birth date/time:  04/04/2018 20:44:00 Delivery type:  Vaginal, Spontaneous     Baby Feeding: Breast Disposition:home with mother   04/06/2018 Sharyon CableVeronica C Harrington Jobe, CNM

## 2018-04-06 NOTE — Lactation Note (Signed)
This note was copied from a baby's chart. Lactation Consultation Note  Patient Name: Shelley [name] [id number]'s Date: 04/06/2018 Reason for consult: Follow-up assessment;Infant < 6lbs   Baby 36 hours old and latched upon entering w/ intermittent swallows.  Stools green. Reviewed hand expression w/ drops expressed.  Encouraged mother to spoon feed baby after every feeding w/ breastmilk. Also provided mother w/ manual pump to express volume.  Mom encouraged to feed baby 8-12 times/24 hours and with feeding cues at least q 3 hours.  Reviewed engorgement care and monitoring voids/stools.      Maternal Data    Feeding Feeding Type: Breast Fed Length of feed: 10 min  LATCH Score Latch: Grasps breast easily, tongue down, lips flanged, rhythmical sucking.(latched upon entering)  Audible Swallowing: A few with stimulation  Type of Nipple: Everted at rest and after stimulation  Comfort (Breast/Nipple): Soft / non-tender  Hold (Positioning): No assistance needed to correctly position infant at breast.  LATCH Score: 9  Interventions Interventions: Breast compression;Hand express;Hand pump  Lactation Tools Discussed/Used     Consult Status Consult Status: Follow-up Date: 04/07/18 Follow-up type: In-patient    Dahlia ByesBerkelhammer, Wednesday Ericsson [facility] 04/06/2018, 9:08 AM

## 2018-04-10 ENCOUNTER — Ambulatory Visit (INDEPENDENT_AMBULATORY_CARE_PROVIDER_SITE_OTHER): Payer: Medicaid Other | Admitting: Obstetrics & Gynecology

## 2018-04-10 ENCOUNTER — Telehealth: Payer: Self-pay

## 2018-04-10 ENCOUNTER — Encounter: Payer: Self-pay | Admitting: Obstetrics & Gynecology

## 2018-04-10 VITALS — BP 138/88 | HR 88

## 2018-04-10 DIAGNOSIS — O165 Unspecified maternal hypertension, complicating the puerperium: Secondary | ICD-10-CM

## 2018-04-10 NOTE — Telephone Encounter (Signed)
Shelley HesselbachMaria [facility] called and left message with patient's home BP of 122/79, states that pt is taking BP med.

## 2018-04-10 NOTE — Progress Notes (Signed)
Subjective:  Shelley MunroeFateemah Kysar is a 27 y.o. female here for BP check.   Hypertension ROS: taking medications as instructed, no medication side effects noted, no TIA's, no chest pain on exertion, no dyspnea on exertion and no swelling of ankles.    Objective:  BP 138/88   Pulse 88   Appearance alert, well appearing, and in no distress. General exam BP noted to be well controlled today in office.    Assessment:   Blood Pressure stable.   Plan:  BP reviewed with Dr Macon LargeAnyanwu. Current treatment plan is effective, no change in therapy.  Pt to be seen for PP visit on 7/17..Marland Kitchen

## 2018-04-10 NOTE — Progress Notes (Signed)
I have reviewed the chart and agree with nursing staff's documentation of this patient's encounter.  Jaynie CollinsUgonna Toan Mort, MD 04/10/2018 11:40 AM

## 2018-04-30 ENCOUNTER — Ambulatory Visit (INDEPENDENT_AMBULATORY_CARE_PROVIDER_SITE_OTHER): Payer: Medicaid Other | Admitting: Nurse Practitioner

## 2018-04-30 ENCOUNTER — Encounter: Payer: Self-pay | Admitting: Nurse Practitioner

## 2018-04-30 DIAGNOSIS — Z3202 Encounter for pregnancy test, result negative: Secondary | ICD-10-CM

## 2018-04-30 DIAGNOSIS — O9102 Infection of nipple associated with the puerperium: Secondary | ICD-10-CM

## 2018-04-30 DIAGNOSIS — Z1389 Encounter for screening for other disorder: Secondary | ICD-10-CM

## 2018-04-30 DIAGNOSIS — B3789 Other sites of candidiasis: Secondary | ICD-10-CM

## 2018-04-30 DIAGNOSIS — Z3042 Encounter for surveillance of injectable contraceptive: Secondary | ICD-10-CM

## 2018-04-30 LAB — POCT URINE PREGNANCY: Preg Test, Ur: NEGATIVE

## 2018-04-30 MED ORDER — MEDROXYPROGESTERONE ACETATE 150 MG/ML IM SUSP: 150.0000 mg | INTRAMUSCULAR | Status: DC

## 2018-04-30 MED ORDER — MEDROXYPROGESTERONE ACETATE 150 MG/ML IM SUSP: 150.0000 mg | INTRAMUSCULAR | 3 refills | Status: AC

## 2018-04-30 MED ORDER — FLUCONAZOLE 200 MG PO TABS: 200.0000 mg | ORAL_TABLET | Freq: Every day | ORAL | 0 refills | Status: AC

## 2018-04-30 NOTE — Progress Notes (Signed)
[facility] Marland Kitchen..Post Partum Exam  Shelley [name] is a 27 y.o. 452P2002 female who presents for a postpartum visit. She is 4 weeks postpartum following a spontaneous vaginal delivery. I have fully reviewed the prenatal and intrapartum course. The delivery was at 38.6 gestational weeks and was an induction due to gestationa hypertension and mild-moderate preeclampsia.  Anesthesia: epidural. Postpartum course has been good. Baby's course has been good. Baby is feeding by breast. Bleeding staining only. Bowel function is normal. Bladder function is normal. Patient is not sexually active. Contraception method is none.  Reports no intercourse since baby was born.  Postpartum depression screening:neg  Client did run a fever yesterday but none today.  Had a lump in her left breast at 6 o'clock.  Is much improved today.  Discussed with client to monitor any lumps in her breasts and feed to reduce the area as she may be at risk for mastitis and could develop a fever.  The following portions of the patient's history were reviewed and updated as appropriate: allergies, current medications, past family history, past medical history, past social history, past surgical history and problem list. Last pap smear done 09-27-17 and was Normal  Review of Systems Pertinent items noted in HPI and remainder of comprehensive ROS otherwise negative.    Objective:  unknown if currently breastfeeding.  General:  alert, cooperative and no distress   Breasts:  inspection negative, no nipple discharge or bleeding, no masses or nodularity palpable  No evidence of yeast seen on nipples  No dark areas or redness seen.  No lump identified at 6 o;clock on the left breast.  Lungs: clear to auscultation bilaterally  Heart:  regular rate and rhythm, S1, S2 normal, no murmur, click, rub or gallop  Abdomen: soft, nontender  Pelvic exam deferred      Assessment:  Normal postpartum exam. Pap smear not done at today's visit.  Pap due next 09-2020. Nipple yeast  Plan:   1. Contraception: Depo-Provera injections  To pick up from her pharmacy and return for nurse visit tomorrow for injection.  Advised to make appointment for nurse visit. 2. Baby being treated for thrush. Will treat mother for nipple yeast as they usually reinfect one another. 3. Follow up in: 3 months or as needed.

## 2018-04-30 NOTE — Patient Instructions (Signed)
Candidiasis and Breastfeeding The Candida organism is a fungus that lives in our bodies. It produces yeast cells and is kept at healthy levels by the natural bacteria in our bodies. Candida lives in warm, dark, and moist places of the body, such as skin folds under the breast and wet nipples covered by bras or nursing bra pads. When your body's natural balance of bacteria is upset, Candidacan overgrow, causing an infection. This type of infection is called candidiasis. What increases the risk? You may be at higher risk for developing candidiasis if you or your baby has been taking antibiotic medicines, your nipples are cracked, or you are taking oral contraceptives or steroids (such as for asthma). What are the signs or symptoms?  Severe stinging or burning pain, which may be on the surface of the nipples or may be felt deep inside the breast.  Pain during, in between, or especially right after feedings.  Sharp, shooting pain that spreads (radiates) from the nipple into the breast or into the back or arm.  Nipples suddenly become sore after the first two weeks after you give birth.  Sensitive nipples that may have pain with even a light touch. Nipples may also be: ? Puffy. ? Weepy. ? Itchy. ? Blistering. ? Cracked. ? Scaly. ? Reddish. ? Shiny. ? Flaky. How is this diagnosed? The diagnosis is often made based on the symptoms. Microscopic evaluation of breast discharge or cultures may be needed. How is this treated? Yeast can be passed back and forth between a mother and her baby. The mother and baby may need treatment at the same time in order to clear up the infection, even if one does not have symptoms. Occasionally other family members (especially your sexual partner) may need to be treated at the same time. Treatment may involve:  Applying antifungal cream to your nipples after each feeding.  Washing your nipples with warm water before nursing.  Stopping nursing from the affected  breast and using a breast pump.  Keeping the affected breast empty of milk with nursing or with a breast pump.  Medicine. This may be given if your baby has thrush or diaper rash. If you are nursing and you have candidiasis, your baby should be treated for thrush even if you cannot see any white patches in the baby's mouth.  If your infection is more severe, you may be prescribed medicines by mouth.  Talk to your health care provider before starting treatment. It is important to begin treatment only after making sure other things are not the cause of the problem. Follow these instructions at home: Usually after 24-48 hours, you should feel some improvement. In some cases, symptoms may get worse before they get better.  Only take medicines as directed by your health care provider. Make sure to finish all your medicines.  Only take over-the-counter or prescription medicines for pain, discomfort, or fever as directed by your health care provider.  Give your child medicines as directed by your health care provider. Make sure your child finishes them as directed by your health care provider.  Use creams or ointments as suggested by your health care provider.  Make sure your baby is seen and treated at the same time as you.  Wash your hands often. Wash them before and after nursing and changing your baby's diaper and after using the bathroom. Use hot, soapy water. Use soft towels or cloths to pat yourself dry.  Wash your baby's hands often, especially if he or she   sucks on his or her fingers.  If your baby uses a pacifier, it should be boiled for 20 minutes a day and replaced every week.  Nurse more often but for shorter periods of time. Start nursing on the least sore side.  Wash your breast pump and all its parts thoroughly in a bleach solution. Boil all parts that touch the milk (except the rubber gaskets).  If nursing becomes too painful, you may want to pump your milk temporarily and  feed it to your baby. Do not save or freeze this milk because if given to the baby after treatment is completed, it could cause the infection to return.  Eat yogurt that has live active cultures and take oral acidophilus.  Air dry your nipples after nursing.  Change bra pads after each feeding.  Wear 100% cotton bras and wash them every day in hot water.  Wash any towels or clothing that comes in contact with the infected area in very hot water (above 122F [50C]).  Contact a health care provider if: Seek medical care if:  You or your baby are not getting better or are getting worse with the treatment.  Your breasts develop shooting pains, discomfort, itching, or burning after you take antibiotics.  Get help right away if: Seek immediate medical care if:  You have a fever or persistent symptoms for more than 2-3 days.  You have a fever and your symptoms suddenly get worse.  You develop swelling and severe pain in your breast.  You develop blisters on your breast.  You feel a lump in your breast, with or without pain.  Your nipple starts bleeding.  This information is not intended to replace advice given to you by your health care provider. Make sure you discuss any questions you have with your health care provider. Document Released: 01/26/2005 Document Revised: 03/08/2016 Document Reviewed: 03/25/2013 Elsevier Interactive Patient Education  2018 [employer]  

## 2018-05-05 ENCOUNTER — Ambulatory Visit (INDEPENDENT_AMBULATORY_CARE_PROVIDER_SITE_OTHER): Payer: Medicaid Other

## 2018-05-05 DIAGNOSIS — Z3042 Encounter for surveillance of injectable contraceptive: Secondary | ICD-10-CM | POA: Diagnosis not present

## 2018-05-05 DIAGNOSIS — Z3202 Encounter for pregnancy test, result negative: Secondary | ICD-10-CM | POA: Diagnosis not present

## 2018-05-05 LAB — POCT URINE PREGNANCY: Preg Test, Ur: NEGATIVE

## 2018-05-05 MED ORDER — MEDROXYPROGESTERONE ACETATE 150 MG/ML IM SUSP
150.0000 mg | Freq: Once | INTRAMUSCULAR | Status: AC
  Administered 2018-05-05: 150 mg via INTRAMUSCULAR

## 2018-05-05 NOTE — Progress Notes (Addendum)
Nurse visit for pt supplied Depo given R upper outer quad w/o difficulty. 2nd UPT neg today. Pt denies unprotected IC x 14 days. Next Depo due 10/7-10/21.

## 2018-07-23 ENCOUNTER — Ambulatory Visit: Payer: Medicaid Other

## 2021-09-20 ENCOUNTER — Ambulatory Visit: Payer: Medicaid Other | Admitting: Family Medicine

## 2023-03-21 ENCOUNTER — Ambulatory Visit
Admission: EM | Admit: 2023-03-21 | Discharge: 2023-03-21 | Disposition: A | Discharge status: Home / Self Care | Payer: Medicaid Other | Attending: Nurse Practitioner | Admitting: Nurse Practitioner

## 2023-03-21 DIAGNOSIS — H66002 Acute suppurative otitis media without spontaneous rupture of ear drum, left ear: Secondary | ICD-10-CM

## 2023-03-21 DIAGNOSIS — J209 Acute bronchitis, unspecified: Secondary | ICD-10-CM

## 2023-03-21 DIAGNOSIS — H9202 Otalgia, left ear: Secondary | ICD-10-CM | POA: Diagnosis not present

## 2023-03-21 DIAGNOSIS — H6123 Impacted cerumen, bilateral: Secondary | ICD-10-CM

## 2023-03-21 DIAGNOSIS — L409 Psoriasis, unspecified: Secondary | ICD-10-CM

## 2023-03-21 MED ORDER — CLOBETASOL PROPIONATE 0.05 % EX CREA
1.0000 | TOPICAL_CREAM | Freq: Two times a day (BID) | CUTANEOUS | 0 refills | Status: DC
Start: 2023-03-21 — End: 2023-09-05

## 2023-03-21 MED ORDER — AMOXICILLIN-POT CLAVULANATE 875-125 MG PO TABS
1.0000 | ORAL_TABLET | Freq: Two times a day (BID) | ORAL | 0 refills | Status: AC
Start: 2023-03-21 — End: 2023-03-31

## 2023-03-21 MED ORDER — FLUTICASONE PROPIONATE 50 MCG/ACT NA SUSP
1.0000 | Freq: Every day | NASAL | 0 refills | Status: AC
Start: 2023-03-21 — End: ?

## 2023-03-21 MED ORDER — PROMETHAZINE-DM 6.25-15 MG/5ML PO SYRP
5.0000 mL | ORAL_SOLUTION | Freq: Four times a day (QID) | ORAL | 0 refills | Status: AC | PRN
Start: 2023-03-21 — End: ?

## 2023-03-21 NOTE — Discharge Instructions (Addendum)
Start Augmentin twice daily for 10 days.  This will cover your ear infection and bronchitis Flonase daily Promethazine DM as needed for cough.  Please note this medication will make you drowsy.  Do not drink alcohol or drive while on this medication. Clobetasol topical cream for your psoriasis twice daily as needed Please follow-up with your PCP if your symptoms do not improve Please go to the ER for any worsening symptoms

## 2023-03-21 NOTE — ED Provider Notes (Signed)
[facility]    CSN: [id number] Arrival date & time: 03/21/23  1006      History   Chief Complaint No chief complaint on file.   HPI Shelley [name] is a 32 y.o. female presents for evaluation of ear pain.  Patient reports 2 weeks of ear pain with muffled hearing.  Does endorse some cough and congestion.  Denies any fevers, sore throat body aches, shortness of breath.  No asthma or smoking history.  She has been taking Tylenol over-the-counter and ear pain drops with minimal improvement.  Patient also has a history of psoriasis that flares over the summer.  She is requesting a refill of her clobetasol cream.  no other concerns at this time.  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 Authorizing Provider  amoxicillin-clavulanate (AUGMENTIN) 875-125 MG tablet Take 1 tablet by mouth every 12 (twelve) hours for 10 days. 03/21/23 03/31/23 Yes Radford Pax, NP  clobetasol cream (TEMOVATE) 0.05 % Apply 1 Application topically 2 (two) times daily. 03/21/23  Yes Radford Pax, NP  fluticasone (FLONASE) 50 MCG/ACT nasal spray Place 1 spray into both nostrils daily. 03/21/23  Yes Radford Pax, NP  promethazine-dextromethorphan (PROMETHAZINE-DM) 6.25-15 MG/5ML syrup Take 5 mLs by mouth 4 (four) times daily as needed for cough. 03/21/23  Yes Radford Pax, NP  acetaminophen (TYLENOL) 500 MG tablet Take 500 mg by mouth  every 6 (six) hours as needed for mild pain or headache.    [provider]  Elastic Bandages & Supports (COMFORT FIT MATERNITY SUPP LG) MISC 1 Units by Does not apply route daily. Patient not taking: Reported on 04/10/2018 02/27/18   Orvilla Cornwall A, CNM  fluconazole (DIFLUCAN) 200 MG tablet Take 1 tablet (200 mg total) by mouth daily. Take 2 tablets on the first day, then once a day for 2 weeks 04/30/18   Currie Paris, NP  ibuprofen (ADVIL,MOTRIN) 200 MG tablet Take 200 mg by mouth every 6 (six) hours as needed.    [provider]  medroxyPROGESTERone (DEPO-PROVERA) 150 MG/ML injection Inject 1 mL (150 mg total) into the muscle every 3 (three) months. 04/30/18   Burleson, Brand Males, NP  NIFEdipine (PROCARDIA-XL/ADALAT CC) 30 MG 24 hr tablet Take 1 tablet (30 mg total) by mouth daily. Patient not taking: Reported on 04/30/2018 04/07/18   Montez Morita, CNM  oxymetazoline (AFRIN) 0.05 % nasal spray Place 1 spray into both nostrils 2 (two) times daily as needed for congestion.    [provider]  Prenat-FeAsp-Meth-FA-DHA w/o A (PRENATE PIXIE) 10-0.6-0.4-200 MG CAPS Take 1 tablet by mouth daily. 10/03/17  Roe Coombs, CNM    Family History Family History  Problem Relation Age of Onset   Diabetes Maternal Grandmother    Hypertension Maternal Grandmother    Asthma Maternal Grandmother     Social History Social History   Tobacco Use   Smoking status: Never   Smokeless tobacco: Never  Substance Use Topics   Alcohol use: No   Drug use: No    Types: Marijuana    Comment: not since pregnancy     Allergies   Patient has no known allergies.   Review of Systems Review of Systems  HENT:  Positive for congestion and ear pain.   Respiratory:  Positive for cough.      Physical Exam Triage Vital Signs ED Triage Vitals  Enc Vitals Group     BP 03/21/23 1019 114/82     Pulse Rate 03/21/23 1019 84     Resp 03/21/23 1019 16     Temp 03/21/23 1019  98.2 F (36.8 C)     Temp Source 03/21/23 1019 Oral     SpO2 03/21/23 1019 97 %     Weight --      Height --      Head Circumference --      Peak Flow --      Pain Score 03/21/23 1018 3     Pain Loc --      Pain Edu? --      Excl. in GC? --    No data found.  Updated Vital Signs BP 114/82 (BP Location: Right Arm)   Pulse 84   Temp 98.2 F (36.8 C) (Oral)   Resp 16   LMP 03/04/2023 (Approximate)   SpO2 97%   Visual Acuity Right Eye Distance:   Left Eye Distance:   Bilateral Distance:    Right Eye Near:   Left Eye Near:    Bilateral Near:     Physical Exam Vitals and nursing note reviewed.  Constitutional:      General: She is not in acute distress.    Appearance: She is well-developed. She is not ill-appearing.  HENT:     Head: Normocephalic and atraumatic.     Right Ear: Tympanic membrane and ear canal normal. There is impacted cerumen.     Left Ear: Tympanic membrane and ear canal normal. There is impacted cerumen.     Ears:     Comments: After irrigation left TM with mild erythema.  Right TM within normal limit    Nose: Congestion present.     Mouth/Throat:     Mouth: Mucous membranes are moist.     Pharynx: Oropharynx is clear. Uvula midline. No posterior oropharyngeal erythema.     Tonsils: No tonsillar exudate or tonsillar abscesses.  Eyes:     Conjunctiva/sclera: Conjunctivae normal.     Pupils: Pupils are equal, round, and reactive to light.  Cardiovascular:     Rate and Rhythm: Normal rate and regular rhythm.     Heart sounds: Normal heart sounds.  Pulmonary:     Effort: Pulmonary effort is normal.     Breath sounds: Normal breath sounds.  Musculoskeletal:     Cervical back: Normal range of motion and neck supple.  Lymphadenopathy:     Cervical: No cervical adenopathy.  Skin:    General: Skin is warm and dry.     Comments: Mildly dry skin to cheeks and forehead of face.   Neurological:     General: No focal deficit present.  Mental Status:  She is alert and oriented to person, place, and time.  Psychiatric:        Mood and Affect: Mood normal.        Behavior: Behavior normal.      UC Treatments / Results  Labs (all labs ordered are listed, but only abnormal results are displayed) Labs Reviewed - No data to display  EKG   Radiology No results found.  Procedures Procedures (including critical care time)  Medications Ordered in UC Medications - No data to display  Initial Impression / Assessment and Plan / UC Course  I have reviewed the triage vital signs and the nursing notes.  Pertinent labs & imaging results that were available during my care of the patient were reviewed by me and considered in my medical decision making (see chart for details).     Start Augmentin for left OM and bronchitis Promethazine Dm for cough Flonase daily Refilled clobetasol for her psoriasis  PCP follow up if symptoms do not improve ER precautions reviewed  Final Clinical Impressions(s) / UC Diagnoses   Final diagnoses:  Bilateral impacted cerumen  Left ear pain  Non-recurrent acute suppurative otitis media of left ear without spontaneous rupture of tympanic membrane  Acute bronchitis, unspecified organism  Psoriasis     Discharge Instructions      Start Augmentin twice daily for 10 days.  This will cover your ear infection and bronchitis Flonase daily Promethazine DM as needed for cough.  Please note this medication will make you drowsy.  Do not drink alcohol or drive while on this medication. Clobetasol topical cream for your psoriasis twice daily as needed Please follow-up with your PCP if your symptoms do not improve Please go to the ER for any worsening symptoms       ED Prescriptions     Medication Sig Dispense Auth. Provider   amoxicillin-clavulanate (AUGMENTIN) 875-125 MG tablet Take 1 tablet by mouth every 12 (twelve) hours for 10 days. 20 tablet Radford Pax, NP   clobetasol cream (TEMOVATE) 0.05  % Apply 1 Application topically 2 (two) times daily. 45 g Radford Pax, NP   fluticasone (FLONASE) 50 MCG/ACT nasal spray Place 1 spray into both nostrils daily. 15.8 mL Radford Pax, NP   promethazine-dextromethorphan (PROMETHAZINE-DM) 6.25-15 MG/5ML syrup Take 5 mLs by mouth 4 (four) times daily as needed for cough. 118 mL Radford Pax, NP      PDMP not reviewed this encounter.   Radford Pax, NP 03/21/23 1109

## 2023-03-21 NOTE — ED Triage Notes (Signed)
Pt presents to UC w/ c/o left ear pain/ muffled hearing for 2 weeks. Pt has tried ear drops and tylenol. Pt states she's also had a runny nose and cough.  Psoriasis flare up on face. Pt has been using cream without relief.

## 2023-09-05 ENCOUNTER — Ambulatory Visit
Admission: EM | Admit: 2023-09-05 | Discharge: 2023-09-05 | Disposition: A | Discharge status: Home / Self Care | Payer: Commercial Managed Care - HMO | Attending: Emergency Medicine | Admitting: Emergency Medicine

## 2023-09-05 DIAGNOSIS — L409 Psoriasis, unspecified: Secondary | ICD-10-CM

## 2023-09-05 MED ORDER — CLOBETASOL PROPIONATE 0.05 % EX OINT: 1.0000 | TOPICAL_OINTMENT | Freq: Two times a day (BID) | CUTANEOUS | 0 refills | Status: AC

## 2023-09-05 NOTE — ED Triage Notes (Signed)
Pt presents with c/o psoriasis flare up. Pt states the flare up started last week.   Pt states she does not apply anything on the areas. Pt states during the cold it gets worse.

## 2023-09-05 NOTE — ED Provider Notes (Signed)
[facility]    CSN: [id number] Arrival date & time: 09/05/23  1425     History   Chief Complaint Chief Complaint  Patient presents with   Psoriasis    HPI Shelley [name] is a 32 y.o. female.  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 Authorizing Provider  clobetasol ointment (TEMOVATE) 0.05 % Apply 1 Application topically 2 (two) times daily. 09/05/23  Yes Christophr Calix, Lurena Joiner, PA-C  acetaminophen (TYLENOL) 500 MG tablet Take 500 mg by mouth every 6 (six) hours as needed for mild pain or headache.    [provider]  fluconazole (DIFLUCAN) 200 MG tablet Take 1 tablet (200 mg total) by mouth daily. Take 2 tablets on the first day, then once a day for 2 weeks 04/30/18   Currie Paris, NP  fluticasone (FLONASE) 50 MCG/ACT nasal spray Place 1 spray into both nostrils daily. 03/21/23   Radford Pax, NP  ibuprofen (ADVIL,MOTRIN) 200 MG tablet Take 200 mg by mouth every 6 (six) hours as needed.    [provider]  medroxyPROGESTERone (DEPO-PROVERA) 150 MG/ML injection Inject 1 mL (150 mg  total) into the muscle every 3 (three) months. 04/30/18   Burleson, Brand Males, NP  oxymetazoline (AFRIN) 0.05 % nasal spray Place 1 spray into both nostrils 2 (two) times daily as needed for congestion.    [provider]  Prenat-FeAsp-Meth-FA-DHA w/o A (PRENATE PIXIE) 10-0.6-0.4-200 MG CAPS Take 1 tablet by mouth daily. 10/03/17   Orvilla Cornwall A, CNM  promethazine-dextromethorphan (PROMETHAZINE-DM) 6.25-15 MG/5ML syrup Take 5 mLs by mouth 4 (four) times daily as needed for cough. 03/21/23   Radford Pax, NP    Family History Family History  Problem Relation Age of Onset   Diabetes Maternal Grandmother    Hypertension Maternal Grandmother    Asthma Maternal Grandmother     Social History Social History   Tobacco Use   Smoking status: Never   Smokeless tobacco: Never  Substance Use Topics   Alcohol use: No   Drug use: No    Types: Marijuana    Comment: not since pregnancy     Allergies   Patient has no known allergies.   Review of Systems Review of Systems Per HPI  Physical Exam Triage Vital Signs ED Triage Vitals  Encounter Vitals  Group     BP 09/05/23 1435 113/76     Systolic BP Percentile --      Diastolic BP Percentile --      Pulse Rate 09/05/23 1435 82     Resp 09/05/23 1435 17     Temp 09/05/23 1435 98.3 F (36.8 C)     Temp Source 09/05/23 1435 Oral     SpO2 09/05/23 1435 97 %     Weight --      Height --      Head Circumference --      Peak Flow --      Pain Score 09/05/23 1434 0     Pain Loc --      Pain Education --      Exclude from Growth Chart --    No data found.  Updated Vital Signs BP 113/76 (BP Location: Right Arm)   Pulse 82   Temp 98.3 F (36.8 C) (Oral)   Resp 17   LMP 07/06/2023 (Exact Date)   SpO2 97%   Breastfeeding No   Physical Exam Vitals and nursing note reviewed.  Constitutional:      General: She is not in acute distress.    Appearance: Normal appearance.  HENT:     Mouth/Throat:     Mouth: Mucous  membranes are moist.     Pharynx: Oropharynx is clear. No posterior oropharyngeal erythema.  Eyes:     Conjunctiva/sclera: Conjunctivae normal.     Pupils: Pupils are equal, round, and reactive to light.  Cardiovascular:     Rate and Rhythm: Normal rate and regular rhythm.     Pulses: Normal pulses.     Heart sounds: Normal heart sounds.  Pulmonary:     Effort: Pulmonary effort is normal. No respiratory distress.     Breath sounds: Normal breath sounds. No wheezing.  Abdominal:     General: Bowel sounds are normal.     Tenderness: There is no abdominal tenderness.  Musculoskeletal:        General: Normal range of motion.     Cervical back: Normal range of motion.  Skin:    Findings: Rash present.     Comments: Rash in bilateral ears, and pre and post auricular. Scaly, excoriated and inflamed patches  Neurological:     Mental Status: She is alert and oriented to person, place, and time.     UC Treatments / Results  Labs (all labs ordered are listed, but only abnormal results are displayed) Labs Reviewed - No data to display  EKG   Radiology No results found.  Procedures Procedures (including critical care time)  Medications Ordered in UC Medications - No data to display  Initial Impression / Assessment and Plan / UC Course  I have reviewed the triage vital signs and the nursing notes.  Pertinent labs & imaging results that were available during my care of the patient were reviewed by me and considered in my medical decision making (see chart for details).  Psoriasis flare Refilled clobetasol per patient request. Advised on proper use and possible side effects. Recommend topical emollient as well. Follow with dermatology - provided clinic info. Can return or see PCP otherwise. Patient agrees to plan  Final Clinical Impressions(s) / UC Diagnoses   Final diagnoses:  Psoriasis     Discharge Instructions      Clobetasol twice daily to the areas of rash.  Do not  use for longer than 2 weeks.  In between the steroid cream,  please use a topical emollient such as Aquaphor, CeraVe, Cetaphil.  It is very important to keep the skin hydrated to avoid further dry out and peeling  Please follow up with a dermatologist      ED Prescriptions     Medication Sig Dispense Auth. Provider   clobetasol ointment (TEMOVATE) 0.05 % Apply 1 Application topically 2 (two) times daily. 30 g Kristyna Bradstreet, Lurena Joiner, PA-C      PDMP not reviewed this encounter.   Lion Fernandez, Ray Church 09/05/23 2049

## 2023-09-05 NOTE — Discharge Instructions (Signed)
Clobetasol twice daily to the areas of rash.  Do not use for longer than 2 weeks.  In between the steroid cream, please use a topical emollient such as Aquaphor, CeraVe, Cetaphil.  It is very important to keep the skin hydrated to avoid further dry out and peeling  Please follow up with a dermatologist

## 2023-11-14 ENCOUNTER — Other Ambulatory Visit: Payer: Self-pay

## 2023-11-14 ENCOUNTER — Ambulatory Visit: Payer: Commercial Managed Care - HMO | Attending: Orthopedic Surgery | Admitting: Physical Therapy

## 2023-11-14 ENCOUNTER — Encounter: Payer: Self-pay | Admitting: Physical Therapy

## 2023-11-14 DIAGNOSIS — M25511 Pain in right shoulder: Secondary | ICD-10-CM | POA: Insufficient documentation

## 2023-11-14 DIAGNOSIS — M25512 Pain in left shoulder: Secondary | ICD-10-CM | POA: Diagnosis present

## 2023-11-14 DIAGNOSIS — M542 Cervicalgia: Secondary | ICD-10-CM | POA: Diagnosis present
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
## 2023-11-19 ENCOUNTER — Encounter: Payer: Self-pay | Admitting: Physical Therapy

## 2023-11-19 ENCOUNTER — Ambulatory Visit: Payer: Commercial Managed Care - HMO | Attending: Orthopedic Surgery | Admitting: Physical Therapy

## 2023-11-19 DIAGNOSIS — M25511 Pain in right shoulder: Secondary | ICD-10-CM | POA: Diagnosis present

## 2023-11-19 DIAGNOSIS — M542 Cervicalgia: Secondary | ICD-10-CM | POA: Insufficient documentation

## 2023-11-19 DIAGNOSIS — M25512 Pain in left shoulder: Secondary | ICD-10-CM | POA: Insufficient documentation

## 2023-11-21 ENCOUNTER — Ambulatory Visit: Payer: Commercial Managed Care - HMO

## 2023-11-21 DIAGNOSIS — M542 Cervicalgia: Secondary | ICD-10-CM | POA: Diagnosis not present

## 2023-11-21 DIAGNOSIS — M25511 Pain in right shoulder: Secondary | ICD-10-CM
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
## 2023-11-25 ENCOUNTER — Encounter: Payer: Self-pay | Admitting: Physical Therapy

## 2023-11-25 ENCOUNTER — Ambulatory Visit: Payer: Commercial Managed Care - HMO | Admitting: Physical Therapy

## 2023-11-25 DIAGNOSIS — M542 Cervicalgia: Secondary | ICD-10-CM

## 2023-11-25 DIAGNOSIS — M25511 Pain in right shoulder: Secondary | ICD-10-CM
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
## 2023-11-27 ENCOUNTER — Ambulatory Visit: Payer: Commercial Managed Care - HMO | Admitting: Physical Therapy
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
## 2023-11-28 ENCOUNTER — Ambulatory Visit: Payer: Commercial Managed Care - HMO

## 2023-11-28 DIAGNOSIS — M542 Cervicalgia: Secondary | ICD-10-CM | POA: Diagnosis not present

## 2023-11-28 DIAGNOSIS — M25511 Pain in right shoulder: Secondary | ICD-10-CM
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
## 2023-12-03 ENCOUNTER — Ambulatory Visit: Payer: Commercial Managed Care - HMO | Admitting: Physical Therapy

## 2023-12-05 ENCOUNTER — Ambulatory Visit: Payer: Commercial Managed Care - HMO | Admitting: Physical Therapy
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
## 2023-12-09 ENCOUNTER — Encounter: Payer: Self-pay | Admitting: Physical Therapy

## 2023-12-09 ENCOUNTER — Ambulatory Visit: Payer: Commercial Managed Care - HMO | Admitting: Physical Therapy

## 2023-12-09 DIAGNOSIS — M542 Cervicalgia: Secondary | ICD-10-CM

## 2023-12-09 DIAGNOSIS — M25511 Pain in right shoulder: Secondary | ICD-10-CM
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
## 2023-12-10 ENCOUNTER — Ambulatory Visit
Admission: EM | Admit: 2023-12-10 | Discharge: 2023-12-10 | Disposition: A | Discharge status: Home / Self Care | Payer: Commercial Managed Care - HMO | Attending: Family Medicine | Admitting: Family Medicine

## 2023-12-10 DIAGNOSIS — L209 Atopic dermatitis, unspecified: Secondary | ICD-10-CM

## 2023-12-10 HISTORY — DX: Psoriasis, unspecified: L40.9

## 2023-12-10 MED ORDER — PREDNISONE 20 MG PO TABS: ORAL_TABLET | ORAL | 0 refills | Status: AC

## 2023-12-10 MED ORDER — PIMECROLIMUS 1 % EX CREA: TOPICAL_CREAM | Freq: Two times a day (BID) | CUTANEOUS | 0 refills | Status: AC

## 2023-12-10 NOTE — ED Provider Notes (Addendum)
 [facility]  Note:  This document was prepared using Conservation officer, historic buildings and may include unintentional dictation errors.  MRN: [id number] DOB: [birth year]  Subjective:   Shelley [name] is a 33 y.o. female presenting for 1 week history of flareup of suspected psoriasis about the face and neck, behind her ears.  Has been diagnosed with psoriasis by her PCP.  Has been prescribed clobetasol ointment to use which the patient has consistently done.  She is currently out of this.  Has never seen a dermatologist.  Rash is not visible anywhere else except the face, neck and behind her ears.  No current facility-administered medications for this encounter.  Current Outpatient Medications:    acetaminophen (TYLENOL) 500 MG tablet, Take 500 mg by mouth every 6 (six) hours as needed for mild pain or headache., Disp: , Rfl:    clobetasol ointment (TEMOVATE) 0.05 %, Apply 1 Application topically 2 (two) times daily., Disp: 30 g, Rfl: 0   fluconazole (DIFLUCAN) 200 MG tablet, Take 1 tablet (200 mg total) by mouth daily. Take 2 tablets on the first day, then once a day for 2 weeks, Disp: 16 tablet, Rfl: 0   fluticasone (FLONASE) 50 MCG/ACT nasal spray, Place 1 spray into both nostrils daily., Disp: 15.8 mL, Rfl: 0   ibuprofen (ADVIL,MOTRIN) 200 MG tablet, Take 200 mg by mouth every 6 (six) hours as needed., Disp: , Rfl:    medroxyPROGESTERone (DEPO-PROVERA) 150 MG/ML injection, Inject 1 mL (150 mg total) into the muscle every 3 (three) months., Disp: 1 mL, Rfl: 3   oxymetazoline (AFRIN) 0.05 % nasal spray, Place 1 spray into both nostrils 2 (two) times daily as needed for congestion., Disp: , Rfl:    Prenat-FeAsp-Meth-FA-DHA w/o A (PRENATE PIXIE) 10-0.6-0.4-200 MG CAPS, Take 1 tablet by mouth daily., Disp: 30 capsule, Rfl: 12   promethazine-dextromethorphan (PROMETHAZINE-DM) 6.25-15 MG/5ML syrup, Take 5 mLs by mouth 4 (four) times daily as needed for cough., Disp: 118 mL,  Rfl: 0   No Known Allergies  Past Medical History:  Diagnosis Date   Medical history non-contributory    Mild to moderate pre-eclampsia, antepartum 04/04/2018     Past Surgical History:  Procedure Laterality Date   WISDOM TOOTH EXTRACTION      Family History  Problem Relation Age of Onset   Diabetes Maternal Grandmother    Hypertension Maternal Grandmother    Asthma Maternal Grandmother     Social History   Tobacco Use   Smoking status: Never   Smokeless tobacco: Never  Substance Use Topics   Alcohol use: No   Drug use: No    Types: Marijuana    Comment: not since pregnancy    ROS   Objective:   Vitals: BP 114/79 (BP Location: Right Arm)   Pulse (!) 104   Temp 98.2 F (36.8 C) (Oral)   Resp 16   SpO2 97%   Physical Exam Constitutional:      General: She is not in acute distress.    Appearance: Normal appearance. She is well-developed and normal weight. She is not ill-appearing, toxic-appearing or diaphoretic.  HENT:     Head: Normocephalic and atraumatic.     Right Ear: Tympanic membrane, ear canal and external ear normal. No drainage or tenderness. No middle ear effusion. There is no impacted cerumen. Tympanic membrane is not erythematous or bulging.     Left Ear: Tympanic membrane, ear canal and external ear normal. No drainage or tenderness.  No middle  ear effusion. There is no impacted cerumen. Tympanic membrane is not erythematous or bulging.     Nose: Nose normal. No congestion or rhinorrhea.     Mouth/Throat:     Mouth: Mucous membranes are moist. No oral lesions.     Pharynx: No pharyngeal swelling, oropharyngeal exudate, posterior oropharyngeal erythema or uvula swelling.     Tonsils: No tonsillar exudate or tonsillar abscesses.  Eyes:     General: No scleral icterus.       Right eye: No discharge.        Left eye: No discharge.     Extraocular Movements: Extraocular movements intact.     Right eye: Normal extraocular motion.     Left eye:  Normal extraocular motion.     Conjunctiva/sclera: Conjunctivae normal.  Cardiovascular:     Rate and Rhythm: Normal rate and regular rhythm.     Heart sounds: Normal heart sounds. No murmur heard.    No friction rub. No gallop.  Pulmonary:     Effort: Pulmonary effort is normal. No respiratory distress.     Breath sounds: No stridor. No wheezing, rhonchi or rales.  Chest:     Chest wall: No tenderness.  Musculoskeletal:     Cervical back: Normal range of motion and neck supple.  Lymphadenopathy:     Cervical: No cervical adenopathy.  Skin:    General: Skin is warm and dry.     Findings: Rash (papular sandpaper like rash over the malar area, forehead area lateral and posterior neck, just posterior to the ears bilaterally) present.     Comments: Hypopigmentation about the posterior ears and external most ear canal.  Neurological:     General: No focal deficit present.     Mental Status: She is alert and oriented to person, place, and time.  Psychiatric:        Mood and Affect: Mood normal.        Behavior: Behavior normal.     Assessment and Plan :   PDMP not reviewed this encounter.  1. Atopic dermatitis, unspecified type    Advised that she avoid clobetasol ointment on the face as this is not appropriate.  There are signs of hypopigmentation as a secondary effect from the use of this ointment.  Physical exam findings more consistent with atopic dermatitis as opposed to psoriasis.  Recommended on oral prednisone course, Elidel cream.  Follow-up with her PCP, consider referral to dermatology.  Counseled patient on potential for adverse effects with medications prescribed/recommended today, ER and return-to-clinic precautions discussed, patient verbalized understanding.     Wallis Bamberg, [place] 12/10/23 [phone]

## 2023-12-10 NOTE — ED Triage Notes (Signed)
 Pt c/o psoriasis break out to face and behind ears x 1 week-NAD-steady gait

## 2023-12-12 ENCOUNTER — Ambulatory Visit: Payer: Commercial Managed Care - HMO | Admitting: Physical Therapy

## 2023-12-12 DIAGNOSIS — M542 Cervicalgia: Secondary | ICD-10-CM | POA: Diagnosis not present

## 2023-12-12 DIAGNOSIS — M25512 Pain in left shoulder: Secondary | ICD-10-CM

## 2023-12-12 DIAGNOSIS — M25511 Pain in right shoulder: Secondary | ICD-10-CM
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
## 2023-12-16 ENCOUNTER — Ambulatory Visit
Payer: Commercial Managed Care - HMO | Attending: Orthopedic Surgery | Admitting: Rehabilitative and Restorative Service Providers"

## 2023-12-16 ENCOUNTER — Encounter: Payer: Self-pay | Admitting: Rehabilitative and Restorative Service Providers"

## 2023-12-16 DIAGNOSIS — M25512 Pain in left shoulder: Secondary | ICD-10-CM | POA: Insufficient documentation

## 2023-12-16 DIAGNOSIS — M542 Cervicalgia: Secondary | ICD-10-CM | POA: Diagnosis present

## 2023-12-16 DIAGNOSIS — M25511 Pain in right shoulder: Secondary | ICD-10-CM | POA: Insufficient documentation
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
## 2023-12-19 ENCOUNTER — Ambulatory Visit: Payer: Commercial Managed Care - HMO | Admitting: Rehabilitative and Restorative Service Providers"

## 2023-12-19 ENCOUNTER — Encounter: Payer: Self-pay | Admitting: Rehabilitative and Restorative Service Providers"

## 2023-12-19 DIAGNOSIS — M542 Cervicalgia: Secondary | ICD-10-CM

## 2023-12-19 DIAGNOSIS — M25511 Pain in right shoulder: Secondary | ICD-10-CM
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
## 2023-12-24 ENCOUNTER — Encounter: Payer: Self-pay | Admitting: Rehabilitative and Restorative Service Providers"

## 2023-12-24 ENCOUNTER — Ambulatory Visit: Payer: Commercial Managed Care - HMO | Admitting: Rehabilitative and Restorative Service Providers"

## 2023-12-24 DIAGNOSIS — M542 Cervicalgia: Secondary | ICD-10-CM

## 2023-12-24 DIAGNOSIS — M25511 Pain in right shoulder: Secondary | ICD-10-CM
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
## 2023-12-26 ENCOUNTER — Ambulatory Visit: Payer: Commercial Managed Care - HMO | Admitting: Rehabilitative and Restorative Service Providers"

## 2023-12-31 ENCOUNTER — Ambulatory Visit

## 2024-01-07 ENCOUNTER — Ambulatory Visit

## 2024-03-30 ENCOUNTER — Emergency Department (HOSPITAL_BASED_OUTPATIENT_CLINIC_OR_DEPARTMENT_OTHER)
Admission: EM | Admit: 2024-03-30 | Discharge: 2024-03-30 | Disposition: A | Discharge status: Home / Self Care | Attending: Emergency Medicine | Admitting: Emergency Medicine

## 2024-03-30 ENCOUNTER — Encounter (HOSPITAL_BASED_OUTPATIENT_CLINIC_OR_DEPARTMENT_OTHER): Payer: Self-pay

## 2024-03-30 ENCOUNTER — Other Ambulatory Visit: Payer: Self-pay

## 2024-03-30 DIAGNOSIS — N760 Acute vaginitis: Secondary | ICD-10-CM | POA: Insufficient documentation

## 2024-03-30 DIAGNOSIS — H60501 Unspecified acute noninfective otitis externa, right ear: Secondary | ICD-10-CM | POA: Insufficient documentation

## 2024-03-30 DIAGNOSIS — B9689 Other specified bacterial agents as the cause of diseases classified elsewhere: Secondary | ICD-10-CM | POA: Insufficient documentation

## 2024-03-30 DIAGNOSIS — N898 Other specified noninflammatory disorders of vagina: Secondary | ICD-10-CM | POA: Diagnosis present

## 2024-03-30 LAB — PREGNANCY, URINE: Preg Test, Ur: NEGATIVE

## 2024-03-30 LAB — WET PREP, GENITAL
Sperm: NONE SEEN
Trich, Wet Prep: NONE SEEN
WBC, Wet Prep HPF POC: <10 (ref <10)
Yeast Wet Prep HPF POC: NONE SEEN

## 2024-03-30 LAB — URINALYSIS, ROUTINE W REFLEX MICROSCOPIC
Bilirubin Urine: NEGATIVE
Glucose, UA: NEGATIVE mg/dL
Ketones, ur: NEGATIVE mg/dL
Nitrite: NEGATIVE
Protein, ur: NEGATIVE mg/dL
RBC / HPF: >50 RBC/hpf (ref 0–5)
Specific Gravity, Urine: 1.026 (ref 1.005–1.030)
WBC, UA: >50 WBC/hpf (ref 0–5)
pH: 5.5 (ref 5.0–8.0)

## 2024-03-30 MED ORDER — CIPROFLOXACIN-DEXAMETHASONE 0.3-0.1 % OT SUSP: 4.0000 [drp] | Freq: Two times a day (BID) | OTIC | 0 refills | Status: AC

## 2024-03-30 MED ORDER — METRONIDAZOLE 500 MG PO TABS: 500.0000 mg | ORAL_TABLET | Freq: Two times a day (BID) | ORAL | 0 refills | Status: AC

## 2024-03-30 NOTE — ED Notes (Signed)
 Dc paperwork given and verbally understood.Shelley AasAaron [name]

## 2024-03-30 NOTE — ED Triage Notes (Signed)
 Pt c/o R ear pain onset last night, worse today. States that it kind of triggered the symptoms of a yeast infection too- discharge, itching.

## 2024-03-30 NOTE — ED Provider Notes (Signed)
 [place] [facility] EMERGENCY DEPARTMENT AT [facility] Provider Note   CSN: [id number] Arrival date & time: 03/30/24  1207     Patient presents with: Otalgia and Vaginal Discharge   Shelley [name] is a 33 y.o. female.  With past medical history of psoriasis, HPV presenting to emergency room with complaint of right ear pain and vaginal discharge.  Right ear pain started yesterday and vaginal discharge started this morning.  Discharge is associated with vaginal itching.  Denies vaginal bleeding, dysuria or pelvic pain.  No fevers.  She is sexually active with 1 person. On birth control, dose not think that she is pregnant. Right ear pain satiated with some drainage.  Does not think she has been swimming or in the water. No hearing change.     Otalgia Vaginal Discharge      Prior to Admission medications   Medication Sig Start Date End Date Taking? Authorizing Provider  acetaminophen  (TYLENOL ) 500 MG tablet Take 500 mg by mouth every 6 (six) hours as needed for mild pain or headache.    [provider]  clobetasol  ointment (TEMOVATE ) 0.05 % Apply 1 Application topically 2 (two) times daily. 09/05/23   Rising, Ivette Marks, PA-C  fluconazole  (DIFLUCAN ) 200 MG tablet Take 1 tablet (200 mg total) by mouth daily. Take 2 tablets on the first day, then once a day for 2 weeks 04/30/18   Chestine Costain, NP  fluticasone  (FLONASE ) 50 MCG/ACT nasal spray Place 1 spray into both nostrils daily. 03/21/23   Mayer, Jodi R, NP  ibuprofen  (ADVIL ,MOTRIN ) 200 MG tablet Take 200 mg by mouth every 6 (six) hours as needed.    [provider]  medroxyPROGESTERone  (DEPO-PROVERA ) 150 MG/ML injection Inject 1 mL (150 mg total) into the muscle every 3 (three) months. 04/30/18   Burleson, Saunders Curio, NP  oxymetazoline (AFRIN) 0.05 % nasal spray Place 1 spray into both nostrils 2 (two) times daily as needed for congestion.    [provider]  pimecrolimus  (ELIDEL ) 1 % cream Apply topically 2 (two)  times daily. 12/10/23   Adolph Hoop, PA-C  predniSONE  (DELTASONE ) 20 MG tablet Day 1-5: Take 2 tablets daily. Day 6-10: Take 1 tablet daily. Take tablets with breakfast. 12/10/23   Adolph Hoop, PA-C  Prenat-FeAsp-Meth-FA-DHA w/o A (PRENATE PIXIE ) 10-0.6-0.4-200 MG CAPS Take 1 tablet by mouth daily. 10/03/17   Johnsie Nani A, CNM  promethazine -dextromethorphan (PROMETHAZINE -DM) 6.25-15 MG/5ML syrup Take 5 mLs by mouth 4 (four) times daily as needed for cough. 03/21/23   Mayer, Jodi R, NP    Allergies: Patient has no known allergies.    Review of Systems  HENT:  Positive for ear pain.   Genitourinary:  Positive for vaginal discharge.    Updated Vital Signs BP (!) 133/97 (BP Location: Right Arm)   Pulse 89   Temp 98.4 F (36.9 C)   Resp 16   SpO2 100%   Physical Exam Vitals and nursing note reviewed.  Constitutional:      General: She is not in acute distress.    Appearance: She is not toxic-appearing.  HENT:     Head: Normocephalic and atraumatic.     Right Ear: Tympanic membrane normal. Drainage and tenderness present. No mastoid tenderness.     Left Ear: Ear canal and external ear normal. There is impacted cerumen.   Eyes:     General: No scleral icterus.    Conjunctiva/sclera: Conjunctivae normal.    Cardiovascular:     Rate and Rhythm: Normal rate and  regular rhythm.     Pulses: Normal pulses.     Heart sounds: Normal heart sounds.  Pulmonary:     Effort: Pulmonary effort is normal. No respiratory distress.     Breath sounds: Normal breath sounds.  Abdominal:     General: Abdomen is flat. Bowel sounds are normal.     Palpations: Abdomen is soft.     Tenderness: There is no abdominal tenderness.  Genitourinary:    Vagina: Vaginal discharge present.     Comments: No cervical motion tenderness.   Skin:    General: Skin is warm and dry.     Findings: No lesion.   Neurological:     General: No focal deficit present.     Mental Status: She is alert and oriented to  person, place, and time. Mental status is at baseline.     (all labs ordered are listed, but only abnormal results are displayed) Labs Reviewed  WET PREP, GENITAL - Abnormal; Notable for the following components:      Result Value   Clue Cells Wet Prep HPF POC PRESENT (*)    All other components within normal limits  URINALYSIS, ROUTINE W REFLEX MICROSCOPIC - Abnormal; Notable for the following components:   APPearance CLOUDY (*)    Hgb urine dipstick SMALL (*)    Leukocytes,Ua TRACE (*)    Bacteria, UA RARE (*)    Crystals PRESENT (*)    All other components within normal limits  PREGNANCY, URINE  GC/CHLAMYDIA PROBE AMP ([place]) NOT AT [facility]    EKG: None  Radiology: No results found.   Procedures   Medications Ordered in the ED - No data to display                                  Medical Decision Making Amount and/or Complexity of Data Reviewed Labs: ordered.  Risk Prescription drug management.   This patient presents to the ED for concern of eye pain and vaginal discharge, this involves an extensive number of treatment options, and is a complaint that carries with it a high risk of complications and morbidity.  The differential diagnosis includes PID, urinary tract infection, pregnancy, STD, candidiasis, mastoiditis, acute otitis media, otitis externa    Lab Tests:  I personally interpreted labs.  The pertinent results include:   UA is positive for trace leukocytes and has rare bacteria.  No urinary symptoms feel this is most likely secondary to BV is what prep shows clue cells. Regnancy test is negative, gonorrhea chlamydia is pending.    Problem List / ED Course / Critical interventions / Medication management  Patient presenting with right ear pain.  She has not had associated hearing loss.  No fevers.  No sign of mastoiditis on exam.  Exam is most consistent with external ear infection as external auditory canal has mild swelling and drainage is  present.  Patient has pain with manipulation of ear.  TM is visualized without any evidence of middle ear infection or perforation to TM.  Will start on eardrops for otitis externa given symptoms and findings. Presenting with vaginal discharge started this morning.  She is sexually active but reports is 1 partner.  Gonorrhea and Chlamydia are pending.  Pelvic exam without findings suggesting pelvic inflammatory disease.  Wet prep shows clue cells.  Will treat bacterial vaginosis.  Discussed not having sexual intercourse until after gonorrhea and chlamydia have resulted, expresses  understanding, does not want empiric treatment at this time.     Plan F/u w/ PCP in 2-3d to ensure resolution of sx.  Patient was given return precautions. Patient stable for discharge at this time.  Patient educated on sx/dx and verbalized understanding of plan. Return to ER w/ new or worsening sx.       Final diagnoses:  Bacterial vaginosis  Acute otitis externa of right ear, unspecified type    ED Discharge Orders          Ordered    metroNIDAZOLE  (FLAGYL ) 500 MG tablet  2 times daily        03/30/24 1503    ciprofloxacin-dexamethasone (CIPRODEX) OTIC suspension  2 times daily        03/30/24 1503               Teofil Maniaci, Kandace Organ, PA-C 03/30/24 1523    Scarlette Currier, MD 03/31/24 1811

## 2024-03-30 NOTE — Discharge Instructions (Addendum)
 Do not drink alcohol with Flagyl , it will make you sick.  Follow up with PCP, return to ER with worsening symptoms.

## 2024-03-31 LAB — GC/CHLAMYDIA PROBE AMP (~~LOC~~) NOT AT ARMC
Chlamydia: NEGATIVE
Comment: NEGATIVE
Comment: NORMAL
Neisseria Gonorrhea: NEGATIVE

## 2024-09-28 ENCOUNTER — Ambulatory Visit
Admission: EM | Admit: 2024-09-28 | Discharge: 2024-09-28 | Disposition: A | Discharge status: Home / Self Care | Source: Home / Self Care

## 2024-09-28 DIAGNOSIS — L409 Psoriasis, unspecified: Secondary | ICD-10-CM | POA: Diagnosis not present

## 2024-09-28 DIAGNOSIS — B349 Viral infection, unspecified: Secondary | ICD-10-CM

## 2024-09-28 MED ORDER — LOPERAMIDE HCL 2 MG PO CAPS: 2.0000 mg | ORAL_CAPSULE | Freq: Two times a day (BID) | ORAL | 0 refills | Status: AC | PRN

## 2024-09-28 MED ORDER — ONDANSETRON 8 MG PO TBDP: 8.0000 mg | ORAL_TABLET | Freq: Three times a day (TID) | ORAL | 0 refills | Status: AC | PRN

## 2024-09-28 MED ORDER — CETIRIZINE HCL 10 MG PO TABS: 10.0000 mg | ORAL_TABLET | Freq: Every day | ORAL | 0 refills | Status: AC

## 2024-09-28 MED ORDER — PSEUDOEPHEDRINE HCL 30 MG PO TABS: 30.0000 mg | ORAL_TABLET | Freq: Three times a day (TID) | ORAL | 0 refills | Status: AC | PRN

## 2024-09-28 MED ORDER — IBUPROFEN 600 MG PO TABS: 600.0000 mg | ORAL_TABLET | Freq: Four times a day (QID) | ORAL | 0 refills | Status: AC | PRN

## 2024-09-28 NOTE — ED Triage Notes (Signed)
 Pt reports vomiting when she gets in the car, diarrhea and headache x 1 day. Pepto Bismol gives no relief.

## 2024-09-28 NOTE — Discharge Instructions (Signed)
 We will manage this as a viral illness. For sore throat or cough try using a honey-based tea. Use 3 teaspoons of honey with juice squeezed from half lemon. Place shaved pieces of ginger into 1/2-1 cup of water and warm over stove top. Then mix the ingredients and repeat every 4 hours as needed. Please take ibuprofen  600mg  every 6 hours with food alternating with OR taken together with Tylenol  500mg -650mg  every 6 hours for throat pain, fevers, aches and pains. Hydrate very well with at least 2 liters of water. Eat light meals such as soups (chicken and noodles, vegetable, chicken and wild rice).  Do not eat foods that you are allergic to.  Taking an antihistamine like Zyrtec  (10mg  daily) can help against postnasal drainage, sinus congestion which can cause sinus pain, sinus headaches, throat pain, painful swallowing, coughing.  You can take this together with pseudoephedrine  (Sudafed) at a dose of 30mg  3 times a day or twice daily as needed for the same kind of nasal drip, congestion.  Try to eat light meals including soups, broths and soft foods, fruits.  You may use Zofran  for your nausea and vomiting once every 8 hours.  Imodium  can help with diarrhea but use this carefully limiting it to 1-2 times per day only if you are having a lot of diarrhea.

## 2024-09-28 NOTE — ED Provider Notes (Signed)
 [facility]  Note:  This document was prepared using Conservation officer, historic buildings and may include unintentional dictation errors.  MRN: [id number] DOB: 1991-02-23  Subjective:   Shelley [name] is a 33 y.o. female presenting for 1 day history of runny and stuffy nose, right ear pain, malaise, fatigue, nausea, vomiting, diarrhea, belly pain, sinus headaches. Had a streak of blood in her vomit once.  No fever, bloody stools, cough, chest pain, shortness of breath or wheezing, throat pain, confusion, weakness, recent antibiotic use, hospitalizations or long distance travel.  Has not eaten raw foods, drank unfiltered water.  No history of GI disorders including Crohn's, IBS, ulcerative colitis. No chance of pregnancy per patient.  She has an ongoing rash, psoriasis.  Was previously managed by her PCP.  Had a referral placed that expired.  Her PCP did not refer her to another dermatologist.  No current medications.    Allergies[1]  Past Medical History:  Diagnosis Date   Medical history non-contributory    Mild to moderate pre-eclampsia, antepartum 04/04/2018   Psoriasis    per pt     Past Surgical History:  Procedure Laterality Date   WISDOM TOOTH EXTRACTION      Family History  Problem Relation Age of Onset   Diabetes Maternal Grandmother    Hypertension Maternal Grandmother    Asthma Maternal Grandmother     Social History   Occupational History   Occupation: Publishing Copy: UNEMPLOYED  Tobacco Use   Smoking status: Never   Smokeless tobacco: Never  Vaping Use   Vaping status: Never Used  Substance and Sexual Activity   Alcohol use: Yes    Comment: weekends   Drug use: No   Sexual activity: Yes    Birth control/protection: Injection    ROS   Objective:   Vitals: BP (!) 134/90 (BP Location: Right Arm)   Pulse 83   Temp 98.5 F (36.9 C) (Oral)   Resp 16   SpO2 98%   Physical Exam Constitutional:      General:  She is not in acute distress.    Appearance: Normal appearance. She is well-developed and normal weight. She is not ill-appearing, toxic-appearing or diaphoretic.  HENT:     Head: Normocephalic and atraumatic.     Right Ear: Tympanic membrane, ear canal and external ear normal. No drainage or tenderness. No middle ear effusion. There is no impacted cerumen. Tympanic membrane is not erythematous or bulging.     Left Ear: Tympanic membrane, ear canal and external ear normal. No drainage or tenderness.  No middle ear effusion. There is no impacted cerumen. Tympanic membrane is not erythematous or bulging.     Nose: Congestion present. No rhinorrhea.     Mouth/Throat:     Mouth: Mucous membranes are moist. No oral lesions.     Pharynx: No pharyngeal swelling, oropharyngeal exudate, posterior oropharyngeal erythema or uvula swelling.     Tonsils: No tonsillar exudate or tonsillar abscesses.  Eyes:     General: No scleral icterus.       Right eye: No discharge.        Left eye: No discharge.     Extraocular Movements: Extraocular movements intact.     Right eye: Normal extraocular motion.     Left eye: Normal extraocular motion.     Conjunctiva/sclera: Conjunctivae normal.  Cardiovascular:     Rate and Rhythm: Normal rate and regular rhythm.     Heart sounds: Normal  heart sounds. No murmur heard.    No friction rub. No gallop.  Pulmonary:     Effort: Pulmonary effort is normal. No respiratory distress.     Breath sounds: No stridor. No wheezing, rhonchi or rales.  Chest:     Chest wall: No tenderness.  Abdominal:     General: Bowel sounds are normal. There is no distension.     Palpations: Abdomen is soft. There is no mass.     Tenderness: There is abdominal tenderness (generalized). There is no right CVA tenderness, left CVA tenderness, guarding or rebound.  Musculoskeletal:     Cervical back: Normal range of motion and neck supple.  Lymphadenopathy:     Cervical: No cervical  adenopathy.  Skin:    General: Skin is warm and dry.     Findings: Rash (Dry scaly plaques involving both the ears, right side of her neck) present.  Neurological:     General: No focal deficit present.     Mental Status: She is alert and oriented to person, place, and time.  Psychiatric:        Mood and Affect: Mood normal.        Behavior: Behavior normal.        Thought Content: Thought content normal.        Judgment: Judgment normal.         Assessment and Plan :   PDMP not reviewed this encounter.  1. Acute viral syndrome   2. Psoriasis      Suspect viral syndrome, viral gastroenteritis. Physical exam findings reassuring and vital signs stable for discharge. Advised supportive care, offered symptomatic relief.  I will place a referral for patient to see a dermatologist for further management of her psoriasis.  Counseled patient on potential for adverse effects with medications prescribed/recommended today, ER and return-to-clinic precautions discussed, patient verbalized understanding.      [1] No Known Allergies    Christopher Savannah, [place] 09/28/24 [id number]

## 2025-06-01 ENCOUNTER — Ambulatory Visit: Admitting: Physician Assistant
